# Patient Record
Sex: Female | Born: 1980 | Race: Black or African American | Hispanic: No | Smoking: Current every day smoker
Health system: Southern US, Community
[De-identification: ages and names within clinical notes are randomized; demographics above are authoritative.]

## PROBLEM LIST (undated history)

## (undated) ENCOUNTER — Emergency Department (HOSPITAL_COMMUNITY): Payer: BLUE CROSS/BLUE SHIELD

## (undated) DIAGNOSIS — I10 Essential (primary) hypertension: Secondary | ICD-10-CM

## (undated) DIAGNOSIS — T7840XA Allergy, unspecified, initial encounter: Secondary | ICD-10-CM

## (undated) DIAGNOSIS — A5609 Other chlamydial infection of lower genitourinary tract: Secondary | ICD-10-CM

## (undated) DIAGNOSIS — A630 Anogenital (venereal) warts: Secondary | ICD-10-CM

## (undated) HISTORY — PX: TUBAL LIGATION: SHX77

## (undated) HISTORY — DX: Allergy, unspecified, initial encounter: T78.40XA

## (undated) HISTORY — DX: Other chlamydial infection of lower genitourinary tract: A56.09

## (undated) HISTORY — DX: Essential (primary) hypertension: I10

## (undated) HISTORY — DX: Anogenital (venereal) warts: A63.0

## (undated) HISTORY — PX: WISDOM TOOTH EXTRACTION: SHX21

---
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
## 1998-01-10 ENCOUNTER — Encounter: Admission: RE | Admit: 1998-01-10 | Discharge: 1998-01-10 | Payer: Self-pay | Admitting: Family Medicine

## 1998-01-19 ENCOUNTER — Inpatient Hospital Stay (HOSPITAL_COMMUNITY): Admission: AD | Admit: 1998-01-19 | Discharge: 1998-01-19 | Payer: Self-pay | Admitting: Obstetrics & Gynecology

## 1998-01-20 ENCOUNTER — Encounter: Payer: Self-pay | Admitting: Obstetrics

## 1998-01-20 ENCOUNTER — Inpatient Hospital Stay (HOSPITAL_COMMUNITY): Admission: AD | Admit: 1998-01-20 | Discharge: 1998-01-20 | Payer: Self-pay | Admitting: Obstetrics

## 1998-02-07 ENCOUNTER — Encounter: Admission: RE | Admit: 1998-02-07 | Discharge: 1998-02-07 | Payer: Self-pay | Admitting: Sports Medicine

## 1998-03-07 ENCOUNTER — Encounter: Admission: RE | Admit: 1998-03-07 | Discharge: 1998-03-07 | Payer: Self-pay | Admitting: Sports Medicine

## 1998-03-07 ENCOUNTER — Other Ambulatory Visit: Admission: RE | Admit: 1998-03-07 | Discharge: 1998-03-07 | Payer: Self-pay | Admitting: Family Medicine

## 1998-04-13 ENCOUNTER — Encounter: Admission: RE | Admit: 1998-04-13 | Discharge: 1998-04-13 | Payer: Self-pay | Admitting: Family Medicine

## 1998-05-15 ENCOUNTER — Encounter: Admission: RE | Admit: 1998-05-15 | Discharge: 1998-05-15 | Payer: Self-pay | Admitting: Family Medicine

## 1998-07-05 ENCOUNTER — Encounter: Admission: RE | Admit: 1998-07-05 | Discharge: 1998-07-05 | Payer: Self-pay | Admitting: Family Medicine

## 1998-07-14 ENCOUNTER — Encounter: Admission: RE | Admit: 1998-07-14 | Discharge: 1998-07-14 | Payer: Self-pay | Admitting: Family Medicine

## 1998-08-03 ENCOUNTER — Other Ambulatory Visit: Admission: RE | Admit: 1998-08-03 | Discharge: 1998-08-03 | Payer: Self-pay | Admitting: Emergency Medicine

## 1998-08-28 ENCOUNTER — Encounter: Admission: RE | Admit: 1998-08-28 | Discharge: 1998-08-28 | Payer: Self-pay | Admitting: Family Medicine

## 1998-09-04 ENCOUNTER — Encounter: Admission: RE | Admit: 1998-09-04 | Discharge: 1998-09-04 | Payer: Self-pay | Admitting: Family Medicine

## 1998-09-14 ENCOUNTER — Ambulatory Visit (HOSPITAL_COMMUNITY): Admission: RE | Admit: 1998-09-14 | Discharge: 1998-09-14 | Payer: Self-pay | Admitting: Obstetrics & Gynecology

## 1998-09-15 ENCOUNTER — Encounter: Admission: RE | Admit: 1998-09-15 | Discharge: 1998-09-15 | Payer: Self-pay | Admitting: Family Medicine

## 1998-09-25 ENCOUNTER — Encounter: Admission: RE | Admit: 1998-09-25 | Discharge: 1998-09-25 | Payer: Self-pay | Admitting: Family Medicine

## 1998-09-28 ENCOUNTER — Encounter: Admission: RE | Admit: 1998-09-28 | Discharge: 1998-11-02 | Payer: Self-pay | Admitting: *Deleted

## 1998-10-16 ENCOUNTER — Encounter: Admission: RE | Admit: 1998-10-16 | Discharge: 1998-10-16 | Payer: Self-pay | Admitting: Family Medicine

## 1998-10-19 ENCOUNTER — Encounter: Admission: RE | Admit: 1998-10-19 | Discharge: 1998-10-19 | Payer: Self-pay | Admitting: Family Medicine

## 1998-10-26 ENCOUNTER — Encounter: Admission: RE | Admit: 1998-10-26 | Discharge: 1998-10-26 | Payer: Self-pay | Admitting: Family Medicine

## 1998-10-30 ENCOUNTER — Encounter: Admission: RE | Admit: 1998-10-30 | Discharge: 1998-10-30 | Payer: Self-pay | Admitting: Family Medicine

## 1998-11-30 ENCOUNTER — Encounter: Admission: RE | Admit: 1998-11-30 | Discharge: 1998-11-30 | Payer: Self-pay | Admitting: Family Medicine

## 1998-12-25 ENCOUNTER — Inpatient Hospital Stay (HOSPITAL_COMMUNITY): Admission: AD | Admit: 1998-12-25 | Discharge: 1998-12-25 | Payer: Self-pay | Admitting: Obstetrics & Gynecology

## 1999-01-02 ENCOUNTER — Encounter: Admission: RE | Admit: 1999-01-02 | Discharge: 1999-01-02 | Payer: Self-pay | Admitting: Sports Medicine

## 1999-01-16 ENCOUNTER — Encounter: Admission: RE | Admit: 1999-01-16 | Discharge: 1999-01-16 | Payer: Self-pay | Admitting: Family Medicine

## 1999-01-30 ENCOUNTER — Encounter: Admission: RE | Admit: 1999-01-30 | Discharge: 1999-01-30 | Payer: Self-pay | Admitting: Family Medicine

## 1999-02-07 ENCOUNTER — Inpatient Hospital Stay (HOSPITAL_COMMUNITY): Admission: AD | Admit: 1999-02-07 | Discharge: 1999-02-07 | Payer: Self-pay | Admitting: *Deleted

## 1999-02-09 ENCOUNTER — Encounter: Admission: RE | Admit: 1999-02-09 | Discharge: 1999-02-09 | Payer: Self-pay | Admitting: Family Medicine

## 1999-02-13 ENCOUNTER — Encounter: Admission: RE | Admit: 1999-02-13 | Discharge: 1999-02-13 | Payer: Self-pay | Admitting: Sports Medicine

## 1999-02-23 ENCOUNTER — Encounter: Admission: RE | Admit: 1999-02-23 | Discharge: 1999-02-23 | Payer: Self-pay | Admitting: Family Medicine

## 1999-03-01 ENCOUNTER — Inpatient Hospital Stay (HOSPITAL_COMMUNITY): Admission: AD | Admit: 1999-03-01 | Discharge: 1999-03-01 | Payer: Self-pay | Admitting: *Deleted

## 1999-03-02 ENCOUNTER — Encounter: Admission: RE | Admit: 1999-03-02 | Discharge: 1999-03-02 | Payer: Self-pay | Admitting: Family Medicine

## 1999-03-02 ENCOUNTER — Inpatient Hospital Stay (HOSPITAL_COMMUNITY): Admission: AD | Admit: 1999-03-02 | Discharge: 1999-03-04 | Payer: Self-pay | Admitting: *Deleted

## 1999-04-16 ENCOUNTER — Encounter: Admission: RE | Admit: 1999-04-16 | Discharge: 1999-04-16 | Payer: Self-pay | Admitting: Family Medicine

## 1999-06-21 ENCOUNTER — Encounter: Admission: RE | Admit: 1999-06-21 | Discharge: 1999-06-21 | Payer: Self-pay | Admitting: Sports Medicine

## 1999-07-13 ENCOUNTER — Encounter: Admission: RE | Admit: 1999-07-13 | Discharge: 1999-07-13 | Payer: Self-pay | Admitting: Family Medicine

## 2000-02-21 ENCOUNTER — Encounter: Admission: RE | Admit: 2000-02-21 | Discharge: 2000-02-21 | Payer: Self-pay | Admitting: Family Medicine

## 2000-02-21 ENCOUNTER — Other Ambulatory Visit: Admission: RE | Admit: 2000-02-21 | Discharge: 2000-02-21 | Payer: Self-pay | Admitting: Obstetrics & Gynecology

## 2004-12-12 ENCOUNTER — Ambulatory Visit: Payer: Self-pay | Admitting: Family Medicine

## 2005-04-30 ENCOUNTER — Ambulatory Visit: Payer: Self-pay | Admitting: Family Medicine

## 2005-05-28 ENCOUNTER — Ambulatory Visit: Payer: Self-pay | Admitting: Sports Medicine

## 2005-05-30 ENCOUNTER — Ambulatory Visit: Payer: Self-pay | Admitting: Family Medicine

## 2005-07-08 ENCOUNTER — Ambulatory Visit: Payer: Self-pay | Admitting: Family Medicine

## 2005-08-05 ENCOUNTER — Ambulatory Visit: Payer: Self-pay | Admitting: Family Medicine

## 2005-09-01 ENCOUNTER — Encounter (INDEPENDENT_AMBULATORY_CARE_PROVIDER_SITE_OTHER): Payer: Self-pay | Admitting: *Deleted

## 2005-09-01 LAB — CONVERTED CEMR LAB

## 2005-09-24 ENCOUNTER — Ambulatory Visit: Payer: Self-pay | Admitting: Family Medicine

## 2005-09-24 ENCOUNTER — Encounter (INDEPENDENT_AMBULATORY_CARE_PROVIDER_SITE_OTHER): Payer: Self-pay | Admitting: Specialist

## 2005-09-24 ENCOUNTER — Other Ambulatory Visit: Admission: RE | Admit: 2005-09-24 | Discharge: 2005-09-24 | Payer: Self-pay | Admitting: Family Medicine

## 2005-09-30 ENCOUNTER — Ambulatory Visit: Payer: Self-pay | Admitting: Family Medicine

## 2006-05-01 DIAGNOSIS — A630 Anogenital (venereal) warts: Secondary | ICD-10-CM

## 2006-05-01 DIAGNOSIS — L2089 Other atopic dermatitis: Secondary | ICD-10-CM

## 2006-05-01 DIAGNOSIS — R8789 Other abnormal findings in specimens from female genital organs: Secondary | ICD-10-CM

## 2006-05-01 HISTORY — DX: Anogenital (venereal) warts: A63.0

## 2006-05-02 ENCOUNTER — Encounter (INDEPENDENT_AMBULATORY_CARE_PROVIDER_SITE_OTHER): Payer: Self-pay | Admitting: *Deleted

## 2006-10-28 ENCOUNTER — Ambulatory Visit: Payer: Self-pay | Admitting: Family Medicine

## 2006-10-28 ENCOUNTER — Telehealth (INDEPENDENT_AMBULATORY_CARE_PROVIDER_SITE_OTHER): Payer: Self-pay | Admitting: *Deleted

## 2006-10-28 ENCOUNTER — Encounter (INDEPENDENT_AMBULATORY_CARE_PROVIDER_SITE_OTHER): Payer: Self-pay | Admitting: Family Medicine

## 2006-10-28 LAB — CONVERTED CEMR LAB
Chlamydia, DNA Probe: NEGATIVE
GC Probe Amp, Genital: NEGATIVE
KOH Prep: NEGATIVE

## 2006-10-29 ENCOUNTER — Encounter (INDEPENDENT_AMBULATORY_CARE_PROVIDER_SITE_OTHER): Payer: Self-pay | Admitting: Family Medicine

## 2006-11-05 ENCOUNTER — Encounter (INDEPENDENT_AMBULATORY_CARE_PROVIDER_SITE_OTHER): Payer: Self-pay | Admitting: Family Medicine

## 2006-11-05 ENCOUNTER — Ambulatory Visit: Payer: Self-pay | Admitting: Family Medicine

## 2006-11-05 ENCOUNTER — Other Ambulatory Visit: Admission: RE | Admit: 2006-11-05 | Discharge: 2006-11-05 | Payer: Self-pay | Admitting: Family Medicine

## 2006-11-05 DIAGNOSIS — J309 Allergic rhinitis, unspecified: Secondary | ICD-10-CM | POA: Insufficient documentation

## 2006-11-05 DIAGNOSIS — I10 Essential (primary) hypertension: Secondary | ICD-10-CM

## 2006-11-05 LAB — CONVERTED CEMR LAB
Calcium: 8.9 mg/dL (ref 8.4–10.5)
Cholesterol: 131 mg/dL (ref 0–200)
Glucose, Bld: 100 mg/dL — ABNORMAL HIGH (ref 70–99)
HDL: 31 mg/dL — ABNORMAL LOW (ref >39)
MCHC: 31.9 g/dL (ref 30.0–36.0)
MCV: 92.7 fL (ref 78.0–100.0)
Platelets: 299 10*3/uL (ref 150–400)
Potassium: 4.1 meq/L (ref 3.5–5.3)
RBC: 4.36 M/uL (ref 3.87–5.11)
Sodium: 139 meq/L (ref 135–145)
Total CHOL/HDL Ratio: 4.2
Triglycerides: 132 mg/dL (ref <150)
VLDL: 26 mg/dL (ref 0–40)

## 2006-11-06 ENCOUNTER — Encounter (INDEPENDENT_AMBULATORY_CARE_PROVIDER_SITE_OTHER): Payer: Self-pay | Admitting: Family Medicine

## 2006-11-18 ENCOUNTER — Telehealth (INDEPENDENT_AMBULATORY_CARE_PROVIDER_SITE_OTHER): Payer: Self-pay | Admitting: *Deleted

## 2006-11-18 ENCOUNTER — Ambulatory Visit: Payer: Self-pay | Admitting: Family Medicine

## 2006-12-15 ENCOUNTER — Telehealth: Payer: Self-pay | Admitting: *Deleted

## 2007-01-12 ENCOUNTER — Ambulatory Visit: Payer: Self-pay | Admitting: Family Medicine

## 2007-03-26 ENCOUNTER — Telehealth: Payer: Self-pay | Admitting: *Deleted

## 2007-03-27 ENCOUNTER — Ambulatory Visit: Payer: Self-pay | Admitting: Family Medicine

## 2007-04-13 ENCOUNTER — Ambulatory Visit: Payer: Self-pay | Admitting: Family Medicine

## 2007-04-13 ENCOUNTER — Encounter: Payer: Self-pay | Admitting: Family Medicine

## 2007-04-13 ENCOUNTER — Other Ambulatory Visit: Admission: RE | Admit: 2007-04-13 | Discharge: 2007-04-13 | Payer: Self-pay | Admitting: Family Medicine

## 2007-04-13 LAB — CONVERTED CEMR LAB
Chlamydia, DNA Probe: NEGATIVE
GC Probe Amp, Genital: NEGATIVE
KOH Prep: NEGATIVE

## 2007-04-14 ENCOUNTER — Telehealth: Payer: Self-pay | Admitting: *Deleted

## 2007-04-15 ENCOUNTER — Encounter: Payer: Self-pay | Admitting: Family Medicine

## 2008-05-20 ENCOUNTER — Ambulatory Visit: Payer: Self-pay | Admitting: Family Medicine

## 2008-05-20 ENCOUNTER — Encounter (INDEPENDENT_AMBULATORY_CARE_PROVIDER_SITE_OTHER): Payer: Self-pay | Admitting: Family Medicine

## 2008-05-20 LAB — CONVERTED CEMR LAB
BUN: 12 mg/dL (ref 6–23)
Calcium: 9.5 mg/dL (ref 8.4–10.5)
Chlamydia, DNA Probe: NEGATIVE
Creatinine, Ser: 0.79 mg/dL (ref 0.40–1.20)
Glucose, Bld: 95 mg/dL (ref 70–99)
Potassium: 4.1 meq/L (ref 3.5–5.3)
Whiff Test: NEGATIVE

## 2008-05-22 ENCOUNTER — Encounter (INDEPENDENT_AMBULATORY_CARE_PROVIDER_SITE_OTHER): Payer: Self-pay | Admitting: Family Medicine

## 2008-06-21 ENCOUNTER — Ambulatory Visit: Payer: Self-pay | Admitting: Family Medicine

## 2008-06-21 ENCOUNTER — Encounter (INDEPENDENT_AMBULATORY_CARE_PROVIDER_SITE_OTHER): Payer: Self-pay | Admitting: Family Medicine

## 2008-06-21 ENCOUNTER — Encounter: Payer: Self-pay | Admitting: *Deleted

## 2008-06-21 DIAGNOSIS — E669 Obesity, unspecified: Secondary | ICD-10-CM

## 2008-06-21 LAB — CONVERTED CEMR LAB
Calcium: 9.1 mg/dL (ref 8.4–10.5)
Glucose, Bld: 124 mg/dL — ABNORMAL HIGH (ref 70–99)
Potassium: 4 meq/L (ref 3.5–5.3)
Sodium: 139 meq/L (ref 135–145)

## 2008-07-05 ENCOUNTER — Telehealth (INDEPENDENT_AMBULATORY_CARE_PROVIDER_SITE_OTHER): Payer: Self-pay | Admitting: Family Medicine

## 2008-07-05 ENCOUNTER — Encounter (INDEPENDENT_AMBULATORY_CARE_PROVIDER_SITE_OTHER): Payer: Self-pay | Admitting: Family Medicine

## 2008-07-05 ENCOUNTER — Encounter: Admission: RE | Admit: 2008-07-05 | Discharge: 2008-07-05 | Payer: Self-pay | Admitting: Family Medicine

## 2008-07-28 ENCOUNTER — Ambulatory Visit: Payer: Self-pay | Admitting: Family Medicine

## 2008-07-28 ENCOUNTER — Encounter (INDEPENDENT_AMBULATORY_CARE_PROVIDER_SITE_OTHER): Payer: Self-pay | Admitting: Family Medicine

## 2008-07-28 LAB — CONVERTED CEMR LAB: Varicella IgG: 4.02 — ABNORMAL HIGH

## 2008-08-09 ENCOUNTER — Ambulatory Visit: Payer: Self-pay | Admitting: Family Medicine

## 2008-08-12 ENCOUNTER — Ambulatory Visit: Payer: Self-pay | Admitting: Family Medicine

## 2008-08-12 ENCOUNTER — Encounter (INDEPENDENT_AMBULATORY_CARE_PROVIDER_SITE_OTHER): Payer: Self-pay | Admitting: Family Medicine

## 2008-08-12 LAB — CONVERTED CEMR LAB

## 2008-08-15 ENCOUNTER — Ambulatory Visit: Payer: Self-pay | Admitting: Family Medicine

## 2008-08-15 DIAGNOSIS — A5609 Other chlamydial infection of lower genitourinary tract: Secondary | ICD-10-CM

## 2008-08-15 HISTORY — DX: Other chlamydial infection of lower genitourinary tract: A56.09

## 2008-08-15 LAB — CONVERTED CEMR LAB: Chlamydia, DNA Probe: POSITIVE — AB

## 2008-08-29 ENCOUNTER — Ambulatory Visit: Payer: Self-pay | Admitting: Family Medicine

## 2008-09-16 ENCOUNTER — Telehealth (INDEPENDENT_AMBULATORY_CARE_PROVIDER_SITE_OTHER): Payer: Self-pay | Admitting: Family Medicine

## 2008-09-20 ENCOUNTER — Encounter: Payer: Self-pay | Admitting: Family Medicine

## 2008-09-20 ENCOUNTER — Ambulatory Visit: Payer: Self-pay | Admitting: Family Medicine

## 2008-09-20 LAB — CONVERTED CEMR LAB
Chlamydia, DNA Probe: NEGATIVE
GC Probe Amp, Genital: NEGATIVE
Ketones, urine, test strip: NEGATIVE
Nitrite: NEGATIVE
Urobilinogen, UA: 0.2

## 2008-11-07 ENCOUNTER — Encounter (INDEPENDENT_AMBULATORY_CARE_PROVIDER_SITE_OTHER): Payer: Self-pay | Admitting: *Deleted

## 2008-11-07 DIAGNOSIS — F17200 Nicotine dependence, unspecified, uncomplicated: Secondary | ICD-10-CM

## 2008-11-25 ENCOUNTER — Ambulatory Visit: Payer: Self-pay | Admitting: Family Medicine

## 2009-01-03 ENCOUNTER — Encounter: Payer: Self-pay | Admitting: Family Medicine

## 2009-01-03 ENCOUNTER — Other Ambulatory Visit: Admission: RE | Admit: 2009-01-03 | Discharge: 2009-01-03 | Payer: Self-pay | Admitting: Family Medicine

## 2009-01-03 ENCOUNTER — Ambulatory Visit: Payer: Self-pay | Admitting: Family Medicine

## 2009-01-05 LAB — CONVERTED CEMR LAB: Chlamydia, DNA Probe: NEGATIVE

## 2009-01-11 ENCOUNTER — Encounter: Payer: Self-pay | Admitting: Family Medicine

## 2009-03-27 ENCOUNTER — Telehealth: Payer: Self-pay | Admitting: Family Medicine

## 2009-03-28 ENCOUNTER — Ambulatory Visit: Payer: Self-pay | Admitting: Family Medicine

## 2009-03-28 LAB — CONVERTED CEMR LAB
Blood in Urine, dipstick: NEGATIVE
Ketones, urine, test strip: NEGATIVE
Nitrite: NEGATIVE
Urobilinogen, UA: 1

## 2009-06-01 ENCOUNTER — Telehealth: Payer: Self-pay | Admitting: Family Medicine

## 2009-06-06 ENCOUNTER — Encounter: Payer: Self-pay | Admitting: Family Medicine

## 2009-06-06 ENCOUNTER — Ambulatory Visit: Payer: Self-pay | Admitting: Family Medicine

## 2009-06-06 DIAGNOSIS — F329 Major depressive disorder, single episode, unspecified: Secondary | ICD-10-CM

## 2009-06-07 LAB — CONVERTED CEMR LAB
FSH: 4.7 milliintl units/mL
HCT: 37.8 % (ref 36.0–46.0)
Hemoglobin: 12 g/dL (ref 12.0–15.0)
MCHC: 31.7 g/dL (ref 30.0–36.0)
MCV: 88.9 fL (ref 78.0–100.0)
Prolactin: 7.4 ng/mL
RDW: 14.2 % (ref 11.5–15.5)

## 2009-08-02 ENCOUNTER — Telehealth: Payer: Self-pay | Admitting: Family Medicine

## 2009-08-03 ENCOUNTER — Ambulatory Visit: Payer: Self-pay | Admitting: Family Medicine

## 2009-08-03 ENCOUNTER — Encounter: Payer: Self-pay | Admitting: Family Medicine

## 2009-08-03 LAB — CONVERTED CEMR LAB
Bilirubin Urine: NEGATIVE
Nitrite: NEGATIVE
Protein, U semiquant: NEGATIVE
Urobilinogen, UA: 0.2
Whiff Test: POSITIVE

## 2009-08-06 ENCOUNTER — Encounter: Payer: Self-pay | Admitting: Family Medicine

## 2009-08-10 ENCOUNTER — Encounter: Payer: Self-pay | Admitting: Family Medicine

## 2009-09-12 ENCOUNTER — Telehealth: Payer: Self-pay | Admitting: Family Medicine

## 2010-01-11 ENCOUNTER — Telehealth (INDEPENDENT_AMBULATORY_CARE_PROVIDER_SITE_OTHER): Payer: Self-pay | Admitting: *Deleted

## 2010-01-11 ENCOUNTER — Ambulatory Visit: Payer: Self-pay | Admitting: Family Medicine

## 2010-01-12 ENCOUNTER — Ambulatory Visit: Payer: Self-pay | Admitting: Family Medicine

## 2010-01-12 ENCOUNTER — Encounter: Payer: Self-pay | Admitting: Family Medicine

## 2010-01-12 DIAGNOSIS — N760 Acute vaginitis: Secondary | ICD-10-CM | POA: Insufficient documentation

## 2010-01-12 LAB — CONVERTED CEMR LAB
Chlamydia, DNA Probe: NEGATIVE
GC Probe Amp, Genital: NEGATIVE
HCV Ab: NEGATIVE
HIV: NONREACTIVE

## 2010-01-16 ENCOUNTER — Encounter: Payer: Self-pay | Admitting: Family Medicine

## 2010-01-17 ENCOUNTER — Telehealth: Payer: Self-pay | Admitting: Family Medicine

## 2010-03-23 IMAGING — US US TRANSVAGINAL NON-OB
1 series · 14 of 25 positions shown · non-contrast
Comparison: None

CLINICAL DATA: 6 months of dyspareunia.

TRANSABDOMINAL AND TRANSVAGINAL ULTRASOUND OF PELVIS
TECHNIQUE: Both transabdominal and transvaginal ultrasound
examinations of the pelvis were performed including evaluation of
the uterus, ovaries, adnexal regions, and pelvic cul-de-sac.

[Series 1: us transvaginal non-ob · 0.29mm/px · 14 of 39 slices shown]
[im 1/39]
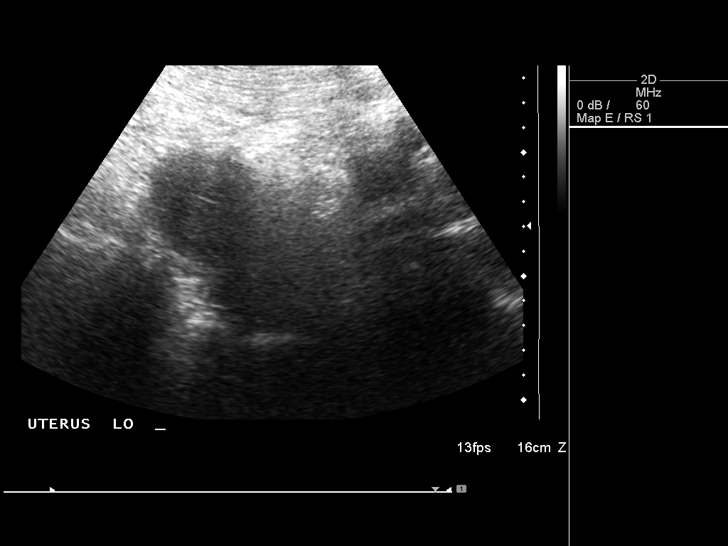
[im 4/39]
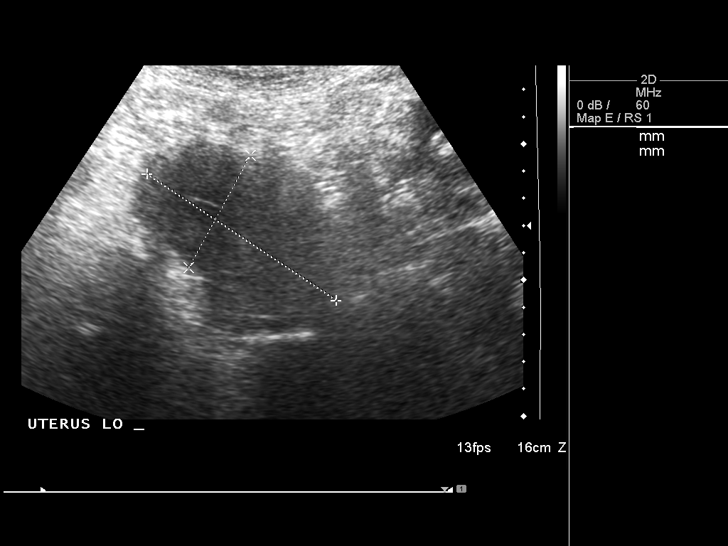
[im 7/39]
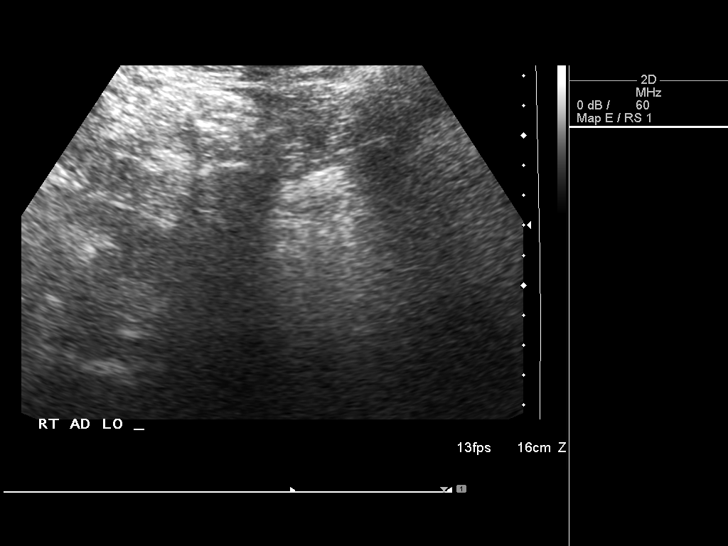
[im 10/39]
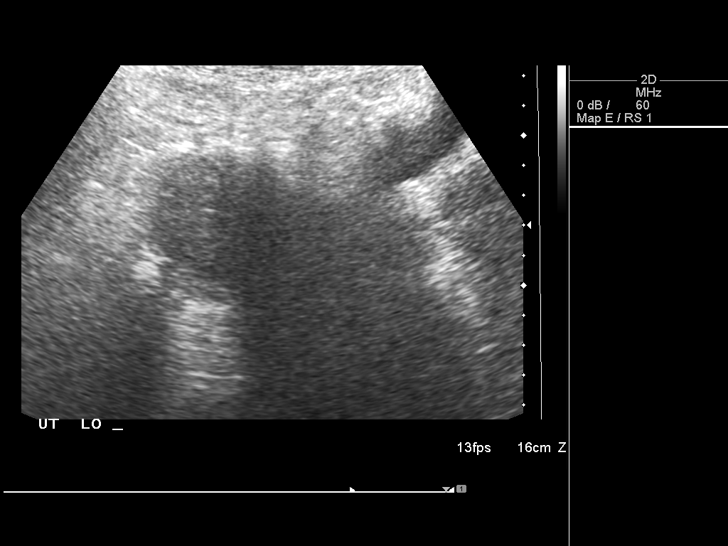
[im 13/39]
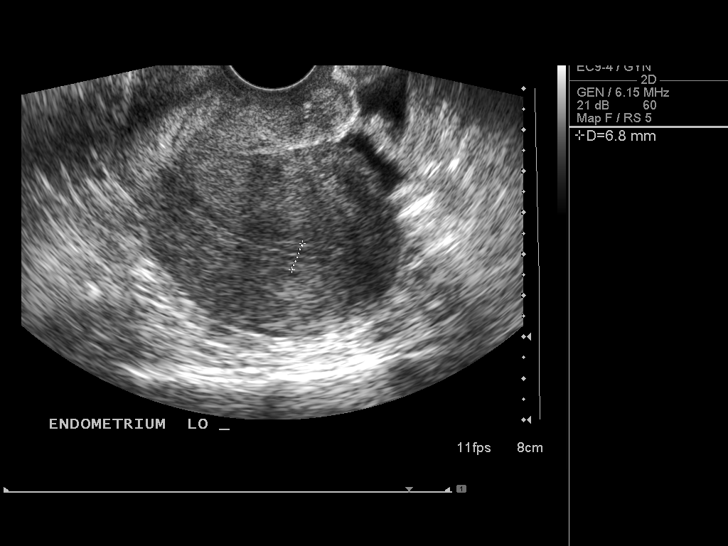
[im 15/39]
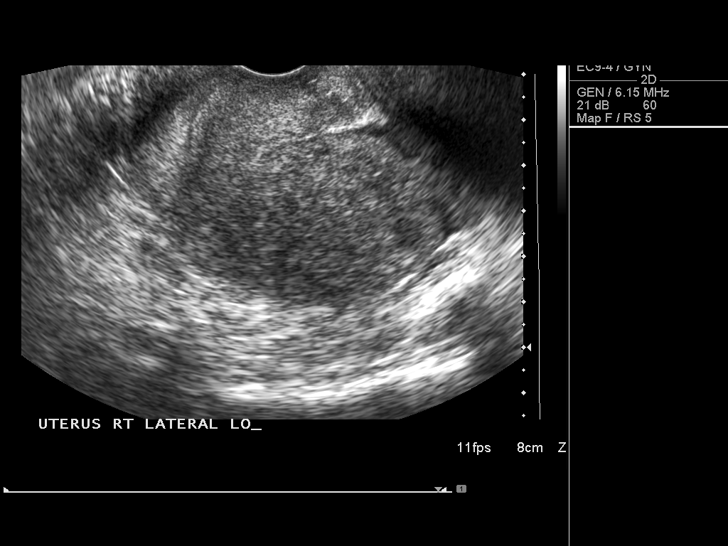
[im 18/39]
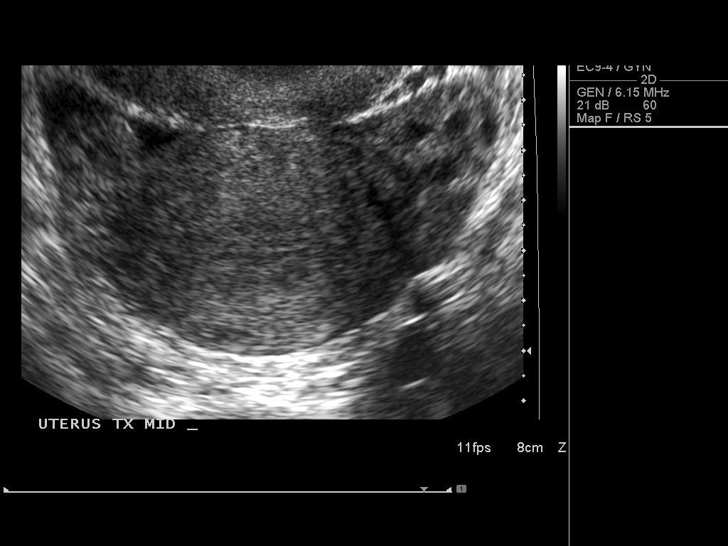
[im 21/39]
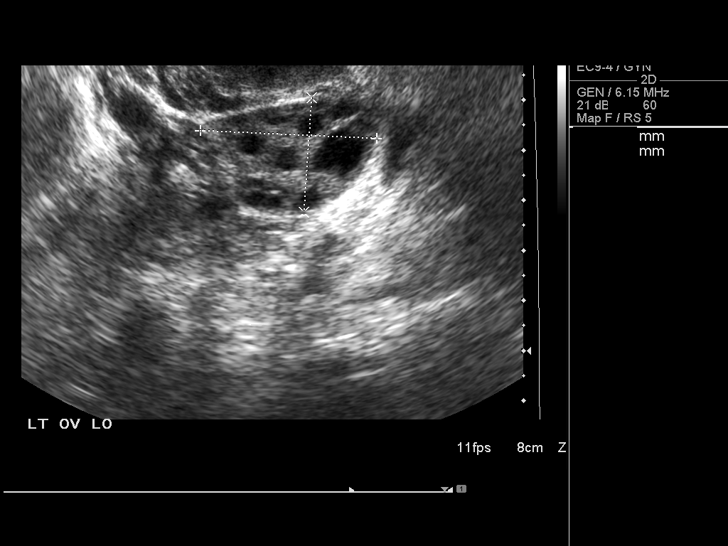
[im 24/39]
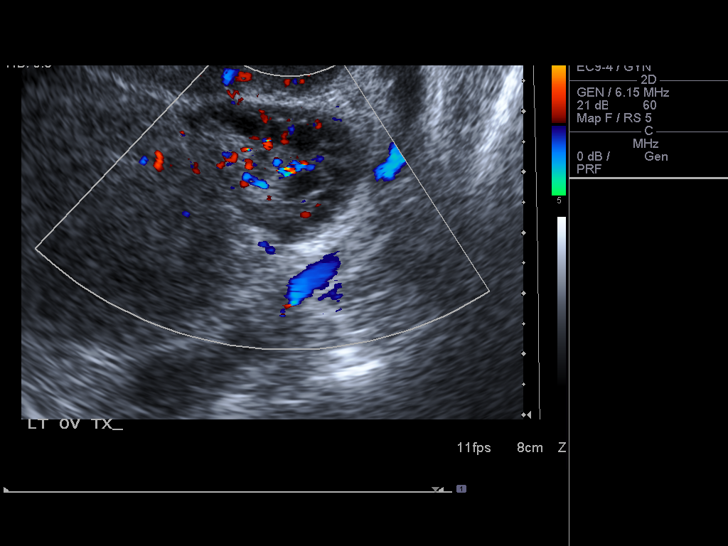
[im 26/39]
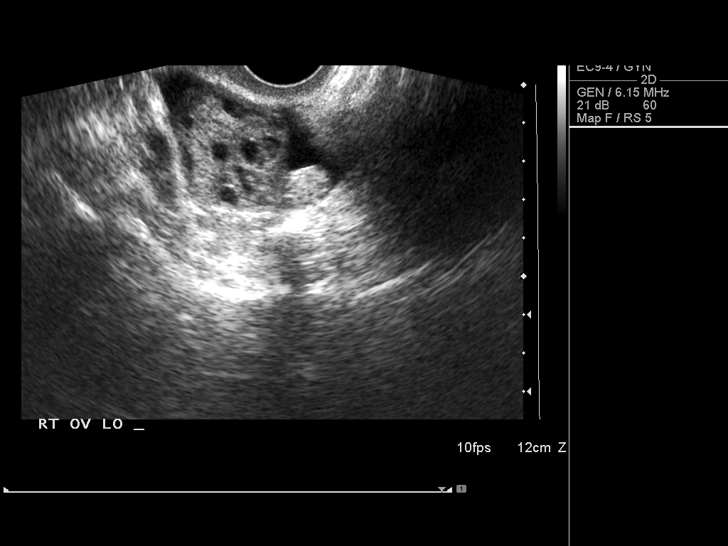
[im 29/39]
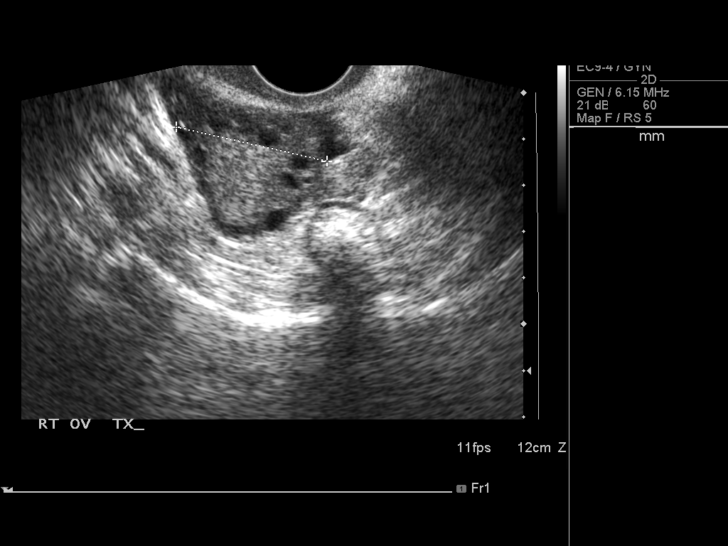
[im 32/39]
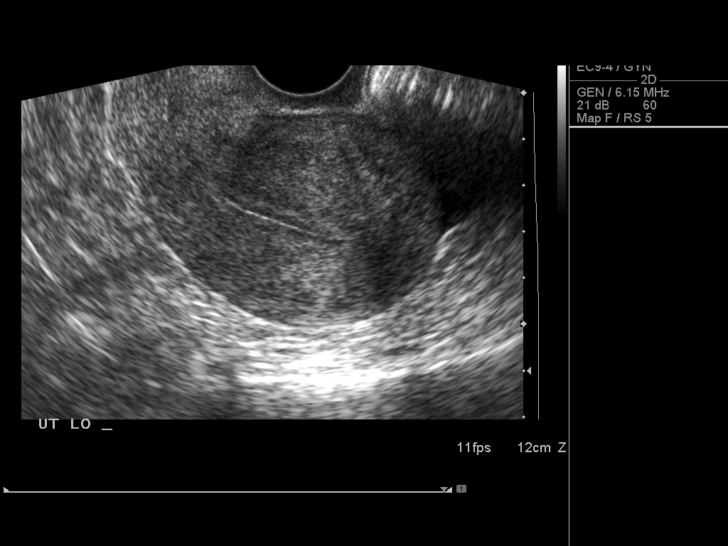
[im 35/39]
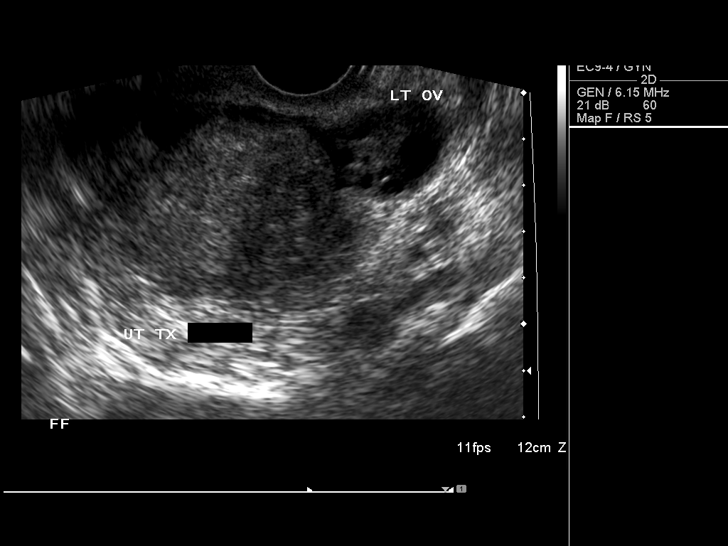
[im 39/39]
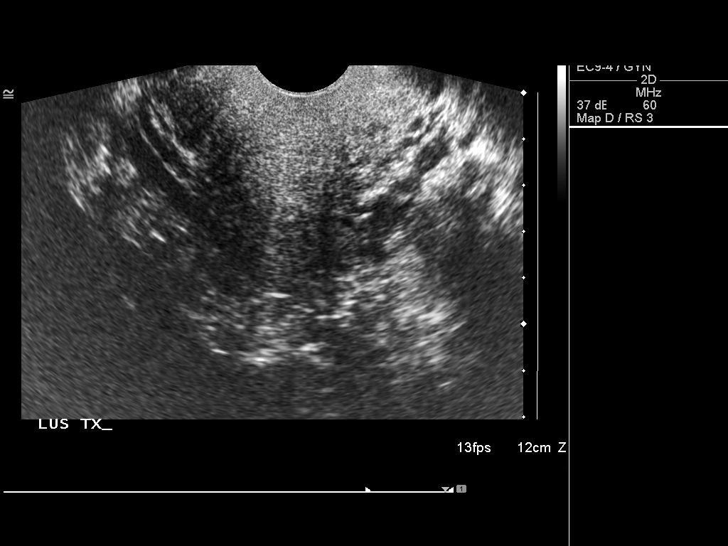

[14 of 25 positions shown; findings below may reference images not displayed]

FINDINGS: Uterus:  Anatomic variant retroflexion of the uterus which is
normal in size measures 8.4 cm long X 4.7 cm AP X 5.7 cm wide.
Uterus is otherwise sonographically normal-appearing.

Endometrium:  Sonographically normal measuring 7 mm thickness.

Right Ovary:  Sonographically normal measuring 4.0 cm long X 3.2 cm
AP X 3.3 cm wide.

Left Ovary:  Sonographically normal measuring 3.5 cm long X 2.3 cm
AP X 2.5 cm wide.

Other Findings:  Slight cul-de-sac free fluid seen transvaginally
only favors physiologic ovulation.
IMPRESSION: 1.  Probable minimal physiologic ovulatory fluid seen
transvaginally only.
2.  Retroflexion of the uterus.
3.  Otherwise, normal.

## 2010-04-03 NOTE — Miscellaneous (Signed)
   Clinical Lists Changes  Problems: Removed problem of VIRAL URI (ICD-465.9) Removed problem of DYSURIA (ICD-788.1) Removed problem of CONTACT OR EXPOSURE TO OTHER VIRAL DISEASES (ICD-V01.79) Removed problem of OTHER ACUTE SINUSITIS (ICD-461.8) Removed problem of DYSPAREUNIA (ICD-625.0) Removed problem of VAGINAL DISCHARGE (ICD-623.5) Removed problem of URINARY FREQUENCY (ICD-788.41) Removed problem of NEED PROPH VACC&INOCULAT AGAINST VIRAL HEP (ICD-V05.3) Removed problem of PRURITUS, VAGINAL (ICD-698.1) Removed problem of DYSPAREUNIA (ICD-625.0) Removed problem of OBESITY, UNSPECIFIED (ICD-278.00) Removed problem of *** LATEX ALLERGY *** (ICD-V15.07) Removed problem of BACTERIAL VAGINITIS (ICD-616.10) Removed problem of PREMENSTRUAL SYNDROME (ICD-625.4) Removed problem of PATELLO-FEMORAL SYNDROME (ICD-719.46)

## 2010-04-03 NOTE — Assessment & Plan Note (Signed)
Summary: discharge/itching/eo   Vital Signs:  Patient profile:   30 year old female Height:      70 inches Weight:      257 pounds BMI:     37.01 BSA:     2.32 Temp:     98.3 degrees F Pulse rate:   84 / minute BP sitting:   126 / 83  Vitals Entered By: Jone Baseman CMA (January 12, 2010 9:34 AM) CC: vaginal irritation Is Patient Diabetic? No Pain Assessment Patient in pain? yes     Location: lower abdomen Intensity: 6   Primary Care Provider:  Myrtie Soman  MD  CC:  vaginal irritation.  History of Present Illness: 1. Vaginal irritation - She thinks that she might have a yeast infection or bacterial vaginitis - She has a hx of chronic yeast infections and BV - She shaved down there recently and since then has had some vaginal irritation and itching  ROS: denies vaginal bleeding, dysuria.  Endorses crampy lower abdominal pain  2. STD exposure - She wants to be checked for STDs - She thinks that the man that she was sexually active with was with other women  ROS: denies fevers, lymph node swelling, vaginal lesions  Habits & Providers  Alcohol-Tobacco-Diet     Tobacco Status: current     Tobacco Counseling: to quit use of tobacco products     Cigarette Packs/Day: 0.5  Current Medications (verified): 1)  Zestoretic 20-12.5 Mg Tabs (Lisinopril-Hydrochlorothiazide) .... One Tablet Daily 2)  Triamcinolone Acetonide 0.1 % Oint (Triamcinolone Acetonide) .... Use 1-2 Times A Day On Arms 3)  Ibuprofen 800 Mg Tabs (Ibuprofen) .... One Three Times A Day As Needed 4)  Tramadol Hcl 50 Mg Tabs (Tramadol Hcl) .Marland Kitchen.. 1-2 Tabs Every 6 Hours As Needed For Menstrual-Related Pain 5)  Metronidazole 500 Mg Tabs (Metronidazole) .Marland Kitchen.. 1 By Mouth Bid  Allergies: 1)  * Latex  Past History:  Past Medical History: Reviewed history from 01/03/2009 and no changes required. h/o chlamydia, h/o spontaneous abortion; G6P4, PUPPS rash recurrent BV episodes 2008 (>3) - on suppressive tx  with metrogel 1-2 times a week recurrent yeast infections - on suppressive tx with twice monthly diflucan  HTN obesity PMS chlamydia 6/10  normal pelvic ultrasound 5/10  Social History: Reviewed history from 05/20/2008 and no changes required. Separated, from [place]. She's an only child.   Has 4 children (1 set of twins).  Works as a Set designer, and going to school fulltime for [employer] - due to graduate in July 2010.  Medicaid.  +tobacco.  No illicits.  No ETOH. Sexually active.  Physical Exam  General:  Vitals reviewed.  Obese.  No acute distress Neck:  no masses.   Lungs:  normal respiratory effort.   Heart:  normal rate and regular rhythm.   Abdomen:  soft, non-tender, normal bowel sounds, no distention, no guarding, no rigidity, and no rebound tenderness.   Genitalia:  minimal vaginal dischargenormal introitus, no external lesions, mucosa pink and moist, no vaginal or cervical lesions, no vaginal atrophy, normal uterus size and position, and no adnexal masses or tenderness.  No cervical motion tenderness. Skin:  color normal, no rashes, and no suspicious lesions.   Psych:  not anxious appearing and not depressed appearing.     Impression & Recommendations:  Problem # 1:  UNSPECIFIED VAGINITIS AND VULVOVAGINITIS (ICD-616.10) Assessment New No yeast or bacterial infection on wet prep.  Likely external irritation from shaving.  Advised OTC hydrocortisone cream for itching.  Her updated medication list for this problem includes:    Metronidazole 500 Mg Tabs (Metronidazole) .Marland Kitchen... 1 by mouth bid  Orders: Wet Prep- [facility] (04540) GC/Chlamydia-[facility] (87591/87491) [facility]- Est Level  3 (98119)  Problem # 2:  SEXUALLY TRANSMITTED DISEASE, EXPOSURE TO (ICD-V01.6) Assessment: New Check the following labs Orders: HIV-[facility] ([id number]) RPR-[facility] [id number]) Hep C Ab-[facility] ([id number]) Hep Bs Ag-[facility] ([id number]) [facility]- Est Level  3 (44010)  Complete Medication List: 1)  Zestoretic 20-12.5 Mg  Tabs (Lisinopril-hydrochlorothiazide) .... One tablet daily 2)  Triamcinolone Acetonide 0.1 % Oint (Triamcinolone acetonide) .... Use 1-2 times a day on arms 3)  Ibuprofen 800 Mg Tabs (Ibuprofen) .... One three times a day as needed 4)  Tramadol Hcl 50 Mg Tabs (Tramadol hcl) .Marland Kitchen.. 1-2 tabs every 6 hours as needed for menstrual-related pain 5)  Metronidazole 500 Mg Tabs (Metronidazole) .Marland Kitchen.. 1 by mouth bid   Orders Added: 1)  Wet Prep- [facility] [87210] 2)  GC/Chlamydia-[facility] [87591/87491] 3)  HIV-[facility] [[id number]] 4)  RPR-[facility] [[id number]] 5)  Hep C Ab-[facility] [[id number]] 6)  Hep Bs Ag-[facility] [[id number]] 7)  [facility]- Est Level  3 [99213]    Laboratory Results  Date/Time Received: January 12, 2010 9:51 AM  Date/Time Reported: January 12, 2010 10:09 AM   [employer] Source: vaginal WBC/hpf: 10-20 Bacteria/hpf: 3+  Rods Clue cells/hpf: none  Negative whiff Yeast/hpf: none Trichomonas/hpf: none Comments: ...........test performed by...........Marland KitchenTerese Door, CMA

## 2010-04-03 NOTE — Assessment & Plan Note (Signed)
Summary: female problem,tcb   Vital Signs:  Patient profile:   30 year old female Height:      70 inches Weight:      271 pounds BMI:     39.03 BSA:     2.38 Temp:     98.3 degrees F Pulse rate:   81 / minute BP sitting:   137 / 93  Vitals Entered By: Jone Baseman CMA (June 06, 2009 10:02 AM) CC: female problem Is Patient Diabetic? No Pain Assessment Patient in pain? yes     Location: stomach Intensity: 6 Type: crampy   Primary Care Provider:  Myrtie Soman  MD  CC:  female problem.  History of Present Illness: 1. Fatigue / abnormal bleeding Bleeding pattern: bleeds heavily for 2-3 days then tapers off; usually has her cycle every 30 days but came early this time (other cycles have been normal); notes increased cramping and pain and, although her bleeding has been reduced to spotting, still feels like she "has her cycle." Period started on Tuesday duration of probem: 1 week. Has been taking 800 mg ibuprofen and midol combination. Also very fatigued for the last several weeks -- this is the most distressing symptom for the patient Last time periods were normal: last cycle was normal Age of menarche: 50 Nulliparous?: no, para 4 Obese?: yes Diabetes?: no personal history of cancer? no family history of cancer? no history of anemia? "borderline" in the past prior treatment: lysteda x 2 months in November 2010 prior studies: abd/pelvic US 5/10, normal ultrasound at gyn 11/10  ROS: chest pain:  no   dyspnea: no    presyncope/syncope: no    fatigue: yes recent weight gain: yes  ~ 20 lbs in 1 year notes increased hair growth on face (states that this is common in her family) in the past year  2. Depressed mood? yes  Anhedonia? some  Sleep - worse, less restorative Interest - worse Guilt - denies Energy - decreased Concentration - denies Appetite - increased Psychomotor retardation - no  Suicidal ideation - no  *denies grandiosity or ever feeling "too  good."  Habits & Providers  Alcohol-Tobacco-Diet     Tobacco Status: current     Tobacco Counseling: to quit use of tobacco products     Cigarette Packs/Day: 0.5  Current Medications (verified): 1)  Zestoretic 20-12.5 Mg Tabs (Lisinopril-Hydrochlorothiazide) .... One Tablet Daily 2)  Triamcinolone Acetonide 0.1 % Oint (Triamcinolone Acetonide) .... Use 1-2 Times A Day On Arms 3)  Ibuprofen 800 Mg Tabs (Ibuprofen) .... One Three Times A Day As Needed  Allergies (verified): 1)  * Latex  Review of Systems       review of systems as noted in HPI section   Physical Exam  General:  vitals signs reviewed -- hypertensive (diastolic); obese  Neck:  ? mild fullness in thyroid area; no discrete nodules, mass, or asymmetry Lungs:  work of breathing unlabored, clear to auscultation bilaterally; no wheezes, rales, or ronchi; good air movement throughout  Heart:  regular rate and rhythm, no murmurs; normal s1/s2  Abdomen:  +BS, soft, obese; tender to palpation over lower abdomen; no rebound/guarding; no mass Genitalia:  Pelvic Exam:        External: normal female genitalia without lesions or masses        Vagina: normal without lesions or masses        Cervix: normal without lesions or masses; some cramping with insertion of GC/chlamydia probe  Adnexa: normal bimanual exam without masses or fullness        Uterus: normal by palpation        Pap smear: not performed Psych:  flat affect but good eye contact; above average intelligence; able to use humor; no grandiosity; thought process and content linear and goal-oriented   Impression & Recommendations:  Problem # 1:  FATIGUE (ICD-780.79) Assessment New  with increased abdominal cramping. Given 20 lb weight gain in past year and report of hursuitism (not noted today as pt plucks) and abnormal bleedin will check prolactin, FSH and TSH to eval for PCOS. Normal abd [place] 5/10 and 11/10. This issue is exacerbated by obesity so will schedule  pt in nutrition. She is agreeable to this. Check for STDs today. Tramadol for cramping and continue ibuprofen.   Orders: [facility]- Est  Level 4 (16109)  Problem # 2:  DEPRESSION, MAJOR (ICD-296.20) Assessment: New  meets criteria. Pt reluctant to start medicine today. Would choose venlafaxine given efficacy and reduced risk of weight gain.   Orders: [facility]- Est  Level 4 (60454)  Problem # 3:  OBESITY, UNSPECIFIED (ICD-278.00) Assessment: Deteriorated 20 lb weight gain over past year. Pt reports dietary indescretion and little/no exercise. States that she knows what she needs to do but is willing to meet with nutrition to discuss strategies for improving this issue. check glucose today.  Problem # 4:  CONTACT OR EXPOSURE TO OTHER VIRAL DISEASES (ICD-V01.79) pt requests STD testing today. Orders: GC/Chlamydia-[facility] (87591/87491) Wet Prep- [facility] [id number]) HIV-[facility] ([id number]) RPR-[facility] [phone])  Complete Medication List: 1)  Zestoretic 20-12.5 Mg Tabs (Lisinopril-hydrochlorothiazide) .... One tablet daily 2)  Triamcinolone Acetonide 0.1 % Oint (Triamcinolone acetonide) .... Use 1-2 times a day on arms 3)  Ibuprofen 800 Mg Tabs (Ibuprofen) .... One three times a day as needed 4)  Tramadol Hcl 50 Mg Tabs (Tramadol hcl) .Marland Kitchen.. 1-2 tabs every 6 hours as needed for menstrual-related pain  Other Orders: CBC-[facility] ([id number]) TSH-[facility] ([id number]) Prolactin-[facility] [id number]) FSH-[facility] [id number]) Glucose-[facility] ([id number])  Patient Instructions: 1)  schedule an appointment with our nutritionist 2)  follow-up with me in 1 month 3)  take the tramadol for pain 4)  we should have your lab results in the next 3-4 days, I'll plan to give you a call regarding them. Prescriptions: TRAMADOL HCL 50 MG TABS (TRAMADOL HCL) 1-2 tabs every 6 hours as needed for menstrual-related pain  #60 x 1   Entered and Authorized by:   Myrtie Soman  MD   Signed by:   Myrtie Soman  MD on 06/06/2009   Method used:    Electronically to        [facility] [phone]* (retail)       [address] [place], [place]  [address]       Ph: [phone] or [phone]       Fax: [phone]   RxID:   [id number]    Prevention & Chronic Care Immunizations   Influenza vaccine: Not documented    Tetanus booster: 05/02/2005: Done.    Pneumococcal vaccine: Not documented  Other Screening   Pap smear: NEGATIVE FOR INTRAEPITHELIAL LESIONS OR MALIGNANCY.  (01/03/2009)   Pap smear due: 01/03/2010   Smoking status: current  (06/06/2009)  Hypertension   Last Blood Pressure: 137 / 93  (06/06/2009)   Serum creatinine: 0.83  (06/21/2008)   Serum potassium 4.0  (06/21/2008)  Self-Management Support :    Hypertension self-management support: Not documented  Laboratory Results  Date/Time Received: June 06, 2009 10:35 AM  Date/Time Reported: June 06, 2009 10:47 AM   [employer] Source: vag WBC/hpf: 1-5 Bacteria/hpf: 3+  Cocci Clue cells/hpf: many  Positive whiff Yeast/hpf: few Trichomonas/hpf: none Comments: ...............test performed by......Marland KitchenBonnie A. [place]az[place], MLS (ASCP)cm

## 2010-04-03 NOTE — Letter (Signed)
Summary: LAB Letter  [facility]  [address]   [place], [place] [address]   Phone: [phone]  Fax: [phone]    08/06/2009  ZYA FINKLE [address] [place], [place]  [address]  Dear Ms. Bergquist,    YOur cervical cultures were normal.       Sincerely,   Denny Levy MD  Appended Document: LAB Letter mailed letter to pt

## 2010-04-03 NOTE — Progress Notes (Signed)
Summary: Triage   Phone Note Call from Patient Call back at Home Phone [phone]   Reason for Call: Talk to Nurse Summary of Call: possible allergic reaction to a condom Initial call taken by: Knox Royalty,  August 02, 2009 2:28 PM  Follow-up for Phone Call        LM Follow-up by: Golden Circle RN,  August 02, 2009 2:53 PM  Additional Follow-up for Phone Call Additional follow up Details #1::        burning around clitoris area. this is a new man & the first time she has used a condom in a "long time"  Previously had been with same man for a long time. it is slightly swollen. painful to touch. wants to be seen asap. this is day 2 of these symptoms. advised no sex until seen. do not use creams or lotions to the area. states she does not use lubes for sex. she agreed with plan & will be here for 9am appt Additional Follow-up by: Golden Circle RN,  August 02, 2009 2:55 PM

## 2010-04-03 NOTE — Consult Note (Signed)
[facility]: [facility]   Imported By: De Nurse 04/07/2009 17:25:36  _____________________________________________________________________  External Attachment:    Type:   Image     Comment:   External Document

## 2010-04-03 NOTE — Assessment & Plan Note (Signed)
Summary: uti & sinus infection per pt/[place]   Vital Signs:  Patient profile:   30 year old female Weight:      263 pounds Temp:     98.2 degrees F Pulse rate:   84 / minute BP sitting:   130 / 92  (right arm)  Vitals Entered By: Renato Battles slade,cma CC: 1.)low back pain x2 weeks. had UTI last month. did not finish her abx from the ED.2.) productive cough and pressure behind eye for few days. chils. Is Patient Diabetic? No Pain Assessment Patient in pain? no        Primary Care Provider:  Myrtie Soman  MD  CC:  1.)low back pain x2 weeks. had UTI last month. did not finish her abx from the ED.2.) productive cough and pressure behind eye for few days. chils..  History of Present Illness: patient complains of nasal congestion and cough for one week, reports associated with ha, pressure behind eyes and body aches.  Cough is productive, greenish phlegm that is worse at night.  Symptoms alleviated somewhat with Afrin.  No associated fever, sore throat, sob and wheezing.  Reports boyfriend has same symptoms.     Habits & Providers  Alcohol-Tobacco-Diet     Tobacco Status: current     Tobacco Counseling: to quit use of tobacco products  Current Medications (verified): 1)  Metronidazole 0.75 % Vag Gel (Metronidazole) .... One Applicator Full To Vagina One Weekly. Suppressive Therapy.  Dispense Quantity Sufficient For 1 Month. 2)  Zestoretic 20-12.5 Mg Tabs (Lisinopril-Hydrochlorothiazide) .... One Tablet Daily 3)  Triamcinolone Acetonide 0.1 % Oint (Triamcinolone Acetonide) .... Use 1-2 Times A Day On Arms 4)  Fluocinonide 0.05 % Oint (Fluocinonide) .... Apply Two Times A Day To Three Times A Day To Hands and Cover With Moisturizer and White Cotton Gloves. Dispense Largest Tube 5)  Zyrtec Allergy 10 Mg Tabs (Cetirizine Hcl) .... One Daily 6)  Ibuprofen 800 Mg Tabs (Ibuprofen) .... One Three Times A Day As Needed  Allergies (verified): 1)  * Latex  Review of Systems General:   Denies fatigue, fever, malaise, and weakness. Eyes:  Denies discharge, eye pain, and light sensitivity. ENT:  Complains of nasal congestion and sinus pressure; denies decreased hearing, difficulty swallowing, ear discharge, earache, hoarseness, nosebleeds, and ringing in ears. CV:  Denies chest pain or discomfort, fatigue, and shortness of breath with exertion. Resp:  Complains of cough and sputum productive; denies chest discomfort, chest pain with inspiration, shortness of breath, and wheezing. GI:  Denies diarrhea, nausea, and vomiting.  Physical Exam  General:  Well-developed,well-nourished,in no acute distress; alert,appropriate and cooperative throughout examination Head:  Normocephalic and atraumatic without obvious abnormalities. Eyes:  No corneal or conjunctival inflammation noted. Vision grossly normal. Ears:  External ear exam shows no significant lesions or deformities.  Otoscopic examination reveals bilateral cerumen impaction, honey colored wet. Hearing is grossly normal bilaterally. Nose:  External nasal examination shows no deformity or inflammation. Nasal mucosa are pink and moist without lesions or exudates. Mouth:  Oral mucosa and oropharynx without lesions or exudates.  Teeth in good repair. Neck:  No deformities, masses, or tenderness noted. Chest Wall:  No deformities, masses, or tenderness noted. Lungs:  Normal respiratory effort, chest expands symmetrically. Lungs are clear to auscultation, no crackles or wheezes. Heart:  Normal rate and regular rhythm. S1 and S2 normal without gallop, murmur, click, rub or other extra sounds. Abdomen:  Bowel sounds positive,abdomen soft and non-tender without masses, organomegaly or hernias noted. Cervical Nodes:  No lymphadenopathy noted   Impression & Recommendations:  Problem # 1:  VIRAL URI (ICD-465.9)  Given symptoms and acute onset, patient likely has viral uri.  education on symptomatic treatment, instructed to drink plenty  of fluids and return for symptoms that are worse, persist for greater than ten days or are associated with fever >101, sob, wheezing or chest pain. Her updated medication list for this problem includes:    Zyrtec Allergy 10 Mg Tabs (Cetirizine hcl) ..... One daily    Ibuprofen 800 Mg Tabs (Ibuprofen) ..... One three times a day as needed  Her updated medication list for this problem includes:    Zyrtec Allergy 10 Mg Tabs (Cetirizine hcl) ..... One daily    Ibuprofen 800 Mg Tabs (Ibuprofen) ..... One three times a day as needed  Orders: [facility]- Est Level  3 (09811)  Problem # 2:  DYSURIA (ICD-788.1)  Recently treated for UTI, she only took 3 days of 7 day course.  Urine normal today  Orders: [facility]- Est Level  3 (91478)  Complete Medication List: 1)  Metronidazole 0.75 % Vag Gel (Metronidazole) .... One applicator full to vagina one weekly. suppressive therapy.  dispense quantity sufficient for 1 month. 2)  Zestoretic 20-12.5 Mg Tabs (Lisinopril-hydrochlorothiazide) .... One tablet daily 3)  Triamcinolone Acetonide 0.1 % Oint (Triamcinolone acetonide) .... Use 1-2 times a day on arms 4)  Fluocinonide 0.05 % Oint (Fluocinonide) .... Apply two times a day to three times a day to hands and cover with moisturizer and white cotton gloves. dispense largest tube 5)  Zyrtec Allergy 10 Mg Tabs (Cetirizine hcl) .... One daily 6)  Ibuprofen 800 Mg Tabs (Ibuprofen) .... One three times a day as needed  Other Orders: Urinalysis-[facility] (00000)  Patient Instructions: 1)  This is likely a viral illness, that will resolve in 7-10 days, keep up with fluids. Prescriptions: ZESTORETIC 20-12.5 MG TABS (LISINOPRIL-HYDROCHLOROTHIAZIDE) one tablet daily  #30 x 1   Entered and Authorized by:   Luretha Murphy NP   Signed by:   Luretha Murphy NP on 03/28/2009   Method used:   [facility] to        [facility] [phone]* (retail)       [address] [place], [place]  [address]       Ph: [phone] or  [phone]       Fax: [phone]   RxID:   [id number]   Laboratory Results   Urine Tests  Date/Time Received: March 28, 2009 9:37 AM  Date/Time Reported: March 28, 2009 9:56 AM   Routine Urinalysis   Color: yellow Appearance: Clear Glucose: negative   (Normal Range: Negative) Bilirubin: negative   (Normal Range: Negative) Ketone: negative   (Normal Range: Negative) Spec. Gravity: 1.025   (Normal Range: 1.003-1.035) Blood: negative   (Normal Range: Negative) pH: 6.0   (Normal Range: 5.0-8.0) Protein: 30   (Normal Range: Negative) Urobilinogen: 1.0   (Normal Range: 0-1) Nitrite: negative   (Normal Range: Negative) Leukocyte Esterace: negative   (Normal Range: Negative)  Urine Microscopic WBC/HPF: 0-3 RBC/HPF: OCC Bacteria/HPF: 1+ Mucous/HPF: 1+ Epithelial/HPF: 1-5 Crystals/HPF: 1-5 CALCIUM OXALATE    Comments: ...........test performed by...........Marland KitchenTerese Door, CMA

## 2010-04-03 NOTE — Progress Notes (Signed)
Summary: triage   Phone Note Call from Patient Call back at Home Phone [phone]   Caller: Patient Summary of Call: Pt thinks she has a sinus inf and a bladder inf and wanting to be seen. Initial call taken by: Clydell Hakim,  March 27, 2009 10:30 AM  Follow-up for Phone Call        uti symptoms x 2 months .went to ED last month & got antibiotic & pain meds. now has severe low back pain. using ibuprofen which helps some.  sinus infection x 1 week. taking OTC for this (afrin,nyquil, etc.) having chills frequently wants appt tomorrow. placed with S. Saxon. pcp not available. told her to continue the motrin & drink plenty of water Follow-up by: Golden Circle RN,  March 27, 2009 10:54 AM

## 2010-04-03 NOTE — Progress Notes (Signed)
Summary: triage   Phone Note Call from Patient Call back at Home Phone [phone]   Caller: Patient Summary of Call: Wants to know if she can take B pollen as a weight loss supplement with the blood pressure meds she takes. Initial call taken by: Clydell Hakim,  June 01, 2009 3:08 PM  Follow-up for Phone Call        told her I will send this to her md & call her with his response Follow-up by: Golden Circle RN,  June 01, 2009 3:22 PM  Additional Follow-up for Phone Call Additional follow up Details #1::        I'm not familiar with bee pollen and cannot vouch for its safety. From what I do understand, there is no evidence that it is helpful in weight loss and therefore I don't recommend it. Diet and exercise are best.  Additional Follow-up by: Myrtie Soman  MD,  June 02, 2009 8:54 AM

## 2010-04-03 NOTE — Progress Notes (Signed)
   Phone Note Outgoing Call   Call placed by: Angelena Sole MD,  January 17, 2010 9:13 AM Action Taken: Phone Call Completed Summary of Call: Discussed with patient lab results.  She had no questions about those.  She was having worse vaginal discharge and I told her to come in and have it rechecked. Initial call taken by: Angelena Sole MD,  January 17, 2010 9:13 AM

## 2010-04-03 NOTE — Progress Notes (Signed)
   Phone Note Call from Patient   Caller: Patient Call For: [phone] Summary of Call: ? Yeast infection x 1 week  Initial call taken by: Abundio Miu,  January 11, 2010 8:48 AM  Follow-up for Phone Call        also having external irritation following shaving. ? yeast infection. appointment scheduled  today. Follow-up by: Theresia Lo RN,  January 11, 2010 9:13 AM

## 2010-04-03 NOTE — Assessment & Plan Note (Signed)
Summary: burning clitoris/[place]/everhart   Vital Signs:  Patient profile:   30 year old female Weight:      266.2 pounds Temp:     98.5 degrees F oral Pulse rate:   92 / minute Pulse rhythm:   regular BP sitting:   131 / 86  (left arm) Cuff size:   large  Vitals Entered By: Loralee Pacas CMA (August 03, 2009 9:32 AM) CC: burning in clitoris x 3 days    Primary Care Provider:  Myrtie Soman  MD  CC:  burning in clitoris x 3 days .  History of Present Illness: 3 days of burning and tingling in external GU area. Some vaginal d/c butthis is pretty chronic. Used condom withlast episode of intercourse--not latex, butafter that is when this irritation started. Nopain with intercourse  Allergies: 1)  * Latex  Physical Exam  Genitalia:  normal introitus and no external lesions.  slight thin white vaginal d/c. no adnexal tenderness. no masses Cervix appears normal, no cervical motion tenderness   Impression & Recommendations:  Problem # 1:  BACTERIAL VAGINITIS (ICD-616.10)  Her updated medication list for this problem includes:    Metronidazole 500 Mg Tabs (Metronidazole) .Marland Kitchen... 1 by mouth bid HO given for BV  Problem # 2:  ECZEMA, ATOPIC DERMATITIS (ICD-691.8)  Her updated medication list for this problem includes:    Triamcinolone Acetonide 0.1 % Oint (Triamcinolone acetonide) ..... Use 1-2 times a day on arms I think her external GU discomfort is related to the condomn use--she has known latex allergy ans she thiks the condom they used was NOT latex but unclear if it had spermicide or otheradditives. Will recommend three days OTC cortisone cream without aloe or benadryly. We discussed shopping for condoms. Also this could be prodrome for herpetic infection adn I discussed that altho unlikely--told her to return w new lesions or worsening signs.  Problem # 3:  PRURITUS, VAGINAL (ICD-698.1) also did GC / Chlamydia  Complete Medication List: 1)  Zestoretic 20-12.5 Mg Tabs  (Lisinopril-hydrochlorothiazide) .... One tablet daily 2)  Triamcinolone Acetonide 0.1 % Oint (Triamcinolone acetonide) .... Use 1-2 times a day on arms 3)  Ibuprofen 800 Mg Tabs (Ibuprofen) .... One three times a day as needed 4)  Tramadol Hcl 50 Mg Tabs (Tramadol hcl) .Marland Kitchen.. 1-2 tabs every 6 hours as needed for menstrual-related pain 5)  Metronidazole 500 Mg Tabs (Metronidazole) .Marland Kitchen.. 1 by mouth bid  Other Orders: Urinalysis-[facility] (00000) Wet Prep- [facility] [phone]) GC/Chlamydia-[facility] ([id number]) [facility]- Est Level  3 (19147) Prescriptions: METRONIDAZOLE 500 MG TABS (METRONIDAZOLE) 1 by mouth bid  #14 x 1   Entered and Authorized by:   Denny Levy MD   Signed by:   Denny Levy MD on 08/03/2009   Method used:   Electronically to        [facility] [phone][facility] (retail)       [address] [place], [place]  [address]       Ph: [phone] or [phone]       Fax: [phone]   RxID:   [id number]   Laboratory Results   Urine Tests  Date/Time Received: August 03, 2009 9:37 AM  Date/Time Reported: August 03, 2009 10:19 AM   Routine Urinalysis   Color: yellow Appearance: Clear Glucose: negative   (Normal Range: Negative) Bilirubin: negative   (Normal Range: Negative) Ketone: negative   (Normal Range: Negative) Spec. Gravity: 1.025   (Normal Range:  1.003-1.035) Blood: negative   (Normal Range: Negative) pH: 6.0   (Normal Range: 5.0-8.0) Protein: negative   (Normal Range: Negative) Urobilinogen: 0.2   (Normal Range: 0-1) Nitrite: negative   (Normal Range: Negative) Leukocyte Esterace: trace   (Normal Range: Negative)  Urine Microscopic WBC/HPF: 0-3 Bacteria/HPF: trace Mucous/HPF: 2+ Epithelial/HPF: 5-10 with few clue cells    Comments: ...............test performed by......Marland KitchenBonnie A. [place], MLS (ASCP)cm  Date/Time Received: August 03, 2009 9:37 AM  Date/Time Reported: August 03, 2009 10:22 AM   [place] Source: vag WBC/hpf: 1-5 Bacteria/hpf: 2+  Cocci Clue cells/hpf:  moderate  Positive whiff Yeast/hpf: none Trichomonas/hpf: none Comments: ...............test performed by......Marland KitchenBonnie A. [place], MLS (ASCP)cm    Appended Document: burning clitoris/[place]/everhart [phone]

## 2010-04-03 NOTE — Assessment & Plan Note (Signed)
Summary: ? yeast infection, vaginal  ,external  irritation/ls  PT LEFT WITHOUT BEING SEEN.Arlyss Repress CMA,  January 11, 2010 2:17 PM  Vital Signs:  Patient profile:   30 year old female Weight:      258 pounds Pulse rate:   83 / minute BP sitting:   123 / 89  (right arm) Cuff size:   large  Vitals Entered By: Arlyss Repress CMA, (January 11, 2010 1:57 PM) CC: VAG ITCHING X 1 WEEK. DENIES D/C. Is Patient Diabetic? No Pain Assessment Patient in pain? no        CC:  VAG ITCHING X 1 WEEK. DENIES D/C.Marland Kitchen  Habits & Providers  Alcohol-Tobacco-Diet     Tobacco Status: current     Tobacco Counseling: to quit use of tobacco products  Allergies: 1)  * Latex   Complete Medication List: 1)  Zestoretic 20-12.5 Mg Tabs (Lisinopril-hydrochlorothiazide) .... One tablet daily 2)  Triamcinolone Acetonide 0.1 % Oint (Triamcinolone acetonide) .... Use 1-2 times a day on arms 3)  Ibuprofen 800 Mg Tabs (Ibuprofen) .... One three times a day as needed 4)  Tramadol Hcl 50 Mg Tabs (Tramadol hcl) .Marland Kitchen.. 1-2 tabs every 6 hours as needed for menstrual-related pain 5)  Metronidazole 500 Mg Tabs (Metronidazole) .Marland Kitchen.. 1 by mouth bid  Other Orders: No Charge Patient Arrived (NCPA0) (NCPA0)   Orders Added: 1)  No Charge Patient Arrived (NCPA0) [NCPA0]

## 2010-04-03 NOTE — Progress Notes (Signed)
Summary: rx   Phone Note Call from Patient Call back at Home Phone [phone]   Caller: Patient Summary of Call: needs Diflucan called in for yeast inf - usually gets a 6 month supply [facility] [address] [place] Initial call taken by: De Nurse,  September 12, 2009 10:31 AM  Follow-up for Phone Call        LM. when she calls back will need appt. diflucan last rx in 2010. not on active list of meds. new pcp has not seen her & will not call in w/o a visit Follow-up by: Golden Circle RN,  September 12, 2009 10:43 AM  Additional Follow-up for Phone Call Additional follow up Details #1::        returning call Additional Follow-up by: Knox Royalty,  September 12, 2009 10:50 AM    Additional Follow-up for Phone Call Additional follow up Details #2::    states she cannot come in until the end of the week. states she will call back when she knows her schedule Follow-up by: Golden Circle RN,  September 12, 2009 11:04 AM

## 2010-04-03 NOTE — Miscellaneous (Signed)
   Clinical Lists Changes  Problems: Removed problem of SCREENING FOR MALIGNANT NEOPLASM OF THE CERVIX (ICD-V76.2) Removed problem of FATIGUE (ICD-780.79) Removed problem of PELVIC PAIN, CHRONIC (ICD-789.09)

## 2010-04-17 ENCOUNTER — Other Ambulatory Visit: Payer: Self-pay | Admitting: Family Medicine

## 2010-04-17 ENCOUNTER — Ambulatory Visit (INDEPENDENT_AMBULATORY_CARE_PROVIDER_SITE_OTHER): Payer: Medicaid Other | Admitting: Family Medicine

## 2010-04-17 ENCOUNTER — Encounter: Payer: Self-pay | Admitting: Family Medicine

## 2010-04-17 VITALS — BP 123/85 | HR 170 | Temp 98.6°F | Ht 70.0 in | Wt 254.3 lb

## 2010-04-17 DIAGNOSIS — N760 Acute vaginitis: Secondary | ICD-10-CM | POA: Insufficient documentation

## 2010-04-17 DIAGNOSIS — L988 Other specified disorders of the skin and subcutaneous tissue: Secondary | ICD-10-CM

## 2010-04-17 DIAGNOSIS — R234 Changes in skin texture: Secondary | ICD-10-CM | POA: Insufficient documentation

## 2010-04-17 LAB — POCT WET PREP (WET MOUNT)

## 2010-04-17 MED ORDER — FLUCONAZOLE 150 MG PO TABS: 150.0000 mg | ORAL_TABLET | Freq: Once | ORAL | Status: DC

## 2010-04-17 MED ORDER — MUPIROCIN CALCIUM 2 % EX CREA: TOPICAL_CREAM | Freq: Three times a day (TID) | CUTANEOUS | Status: AC

## 2010-04-17 NOTE — Assessment & Plan Note (Signed)
Think yeast infection is likely. Obtained wet prep and GC/CHL. Gave RX for flucanazole and will call pt if labs suggest something other than yeast.  Advised weight loss and smoking cessation.  Pt will see Dr. Katrinka Blazing to work on this issue. Pt is motivated.

## 2010-04-17 NOTE — Progress Notes (Signed)
Yeast: Has had history of chronic yeast infections. Has been on fluconazole suppressive therapy in the past when pregnant.  Noted painless white discharge 3 days ago. Recently had BV which she treated with a diluted apple cider vinegar solution bath.  This did not help much. She notes that the d/c is not associated with itching or other symptoms.   Pain on bottom: Notes an in tense pain/itching in her gluteal cleft. This happened after the vinegar solution bath. She tried bacitracin and hydrocortisone on it. No drainage. Not tender in the surrounding area.   ROS: No fevers, chills, anorexia, chest or abdominal pain. No dysurea.   EXAM:  VS noted.  Gen: Well NAD HEENT: MMM Lungs: CTABL Heart: RRR no MRG ABD: NABS, NT, ND Genital: Normal external genitalia. White d/c noted.  Cervix is normal appearing.  Rectal: Normal anus,  ingluteal cleft greater than 2cm from anus is a small fissure about 1-1.5cm long and 2mm wide. No drainage and no obvious area of induration or fluctuance. No surrounding erythremia.

## 2010-04-17 NOTE — Assessment & Plan Note (Signed)
Not sure why this happened. Perhaps the vinegar solution was irritating. I do not think this is an anal fissure, fistula or pilonidal cyst.  Plan for muciporin cream and follow up with Dr. Katrinka Blazing if not healing.

## 2010-04-17 NOTE — Patient Instructions (Signed)
Take the diflucan once and again in 3 days if symptoms present.  I will call you and leave a message if it is not yeast.  Use the muiporin 3x daily until better. Avoid vinegar baths.  Come back in a month or so to establish care with Dr. Katrinka Blazing.  Losing weight will help with your yeast infections.

## 2010-04-18 ENCOUNTER — Telehealth: Payer: Self-pay | Admitting: Family Medicine

## 2010-04-18 LAB — GC/CHLAMYDIA PROBE AMP, GENITAL
Chlamydia, DNA Probe: NEGATIVE
GC Probe Amp, Genital: NEGATIVE

## 2010-04-18 NOTE — Telephone Encounter (Signed)
Called and left a message. Changed the bactroban cream to ointment. Left a message.

## 2010-06-14 ENCOUNTER — Telehealth: Payer: Self-pay | Admitting: Family Medicine

## 2010-06-14 NOTE — Telephone Encounter (Signed)
Agree with plan 

## 2010-06-14 NOTE — Telephone Encounter (Signed)
Needs to talk to nurse regarding her cycle.

## 2010-06-14 NOTE — Telephone Encounter (Signed)
Patient states that her menstrual cycle is very regular and she always knows when she is going to start.  She was due to start today and so far has not.  She began having PMS symptoms this past Sunday (back pain, cramps, breast tenderness, etc).  She's afraid something is wrong.  Told her that cycles can change as women age and since today is her start date, to wait a few days to see if it comes on and if not to call us back.   Patient is sexually active and had a tubal after the birth of her twins 7 years ago.  Explained that if she does not start her period we would bring her in for a pregnancy test.  She will call us back tomorrow with an update.

## 2010-06-15 ENCOUNTER — Ambulatory Visit: Payer: Medicaid Other

## 2010-06-15 ENCOUNTER — Ambulatory Visit (INDEPENDENT_AMBULATORY_CARE_PROVIDER_SITE_OTHER): Payer: Medicaid Other | Admitting: Family Medicine

## 2010-06-15 VITALS — BP 125/75 | HR 81 | Temp 97.7°F | Ht 70.0 in | Wt 257.0 lb

## 2010-06-15 DIAGNOSIS — A499 Bacterial infection, unspecified: Secondary | ICD-10-CM

## 2010-06-15 DIAGNOSIS — N760 Acute vaginitis: Secondary | ICD-10-CM

## 2010-06-15 DIAGNOSIS — F3281 Premenstrual dysphoric disorder: Secondary | ICD-10-CM

## 2010-06-15 DIAGNOSIS — R300 Dysuria: Secondary | ICD-10-CM

## 2010-06-15 DIAGNOSIS — N912 Amenorrhea, unspecified: Secondary | ICD-10-CM

## 2010-06-15 DIAGNOSIS — B9689 Other specified bacterial agents as the cause of diseases classified elsewhere: Secondary | ICD-10-CM

## 2010-06-15 DIAGNOSIS — N943 Premenstrual tension syndrome: Secondary | ICD-10-CM

## 2010-06-15 LAB — POCT URINE PREGNANCY: Preg Test, Ur: NEGATIVE

## 2010-06-15 LAB — POCT URINALYSIS DIPSTICK
Glucose, UA: NEGATIVE
Nitrite, UA: NEGATIVE
Protein, UA: NEGATIVE
Urobilinogen, UA: 0.2

## 2010-06-15 LAB — POCT WET PREP (WET MOUNT)
Trichomonas Wet Prep HPF POC: NEGATIVE
Yeast Wet Prep HPF POC: NEGATIVE

## 2010-06-15 LAB — POCT UA - MICROSCOPIC ONLY

## 2010-06-15 NOTE — Progress Notes (Signed)
Pt stated that she is very regular when it comes to her menstrual cycle however; she feels like "ts just sitting there and it won't come down".  She is having all of the s/s of her menstrual cycle but it hasn't started yet. She is having lots of pain, cramping, pressure and swelling and mood swings.Loralee Pacas [place]

## 2010-06-16 LAB — GC/CHLAMYDIA PROBE AMP, GENITAL: Chlamydia, DNA Probe: NEGATIVE

## 2010-06-17 DIAGNOSIS — B9689 Other specified bacterial agents as the cause of diseases classified elsewhere: Secondary | ICD-10-CM | POA: Insufficient documentation

## 2010-06-17 DIAGNOSIS — F3281 Premenstrual dysphoric disorder: Secondary | ICD-10-CM | POA: Insufficient documentation

## 2010-06-17 NOTE — Assessment & Plan Note (Addendum)
Besides BV today, patient also with monthly symptoms of moderate to severe PMDD that causes her some to significant distress. She meets DSM-IV criteria for following:  -Feeling sad, hopeless, or self-deprecating   -Feeling tense, anxious, or "on edge"  -Marked lability of mood interspersed with frequent tearfulness   -Persistent irritability, anger, and increased interpersonal conflicts   -Difficulty concentrating Feeling fatigued, lethargic, or lacking in energy    -Other physical symptoms, such as breast tenderness or swelling, headaches, joint or muscle pain, a sensation of bloating, weight gain Recommended conservative management with hydration, calcium supplement, and hydration.  Recommended she re-consider an SSRI for this disorder, emphasizing she would only have to take it second half of her cycle (not all the time), and that her symptoms are severe enough where just conservative measures may not help. If SSRI does not appeal to her, then also reconsider OCP. Patient said she would think about this.  Vitamin B6 (not exceeding 100mg  daily) also found to be helpful. Patient interested in trying this for now while she decides whether she wants SSRI/OCP to help control symptoms.

## 2010-06-17 NOTE — Assessment & Plan Note (Addendum)
Although patient denies having usual symptoms of BV, wet prep shows BV. Rx for Flagyl. Patient also interested in suppressive therapy with metronidazole vaginal gel.

## 2010-06-17 NOTE — Progress Notes (Signed)
  Subjective:    Patient ID: Victoria Powers, female    DOB: 20-Jun-1980, 30 y.o.   MRN: [id number]  HPI Patient was seen as work-in for abdominal pain.   Feels bloated, lower abdominal pain for the past 5 days. Periods are usually very regular but period has been 1 day late so far. Patient is very distressed. She came to clinic today, waited 2 hours to see me, and then left. Found crying in her car but was able to persuade to come back to the clinic to see me. Feels like period is "stuck" and cannot get out. Thinks she will feel better if she can menstruate.  Patient has history of PMS. She was offered Paxil and OCP in the past. Did not want to take Paxil; does not like the thought of taking medications affecting her mood/thinking; maybe took about 2 tablets and then stopped. Does not want to take OCP; does not like thought of taking artificial hormones.  Last period 05/19/2010. Regular period. No heavy bleeding. No spotting between periods.  Patient has been sexually active with ex-boyfriend and another partner past several months. Has just had intercourse with ex-boyfriend in past couple of months. Is not sure if ex-boyfriend is having sex with other people; thinks it may be possible since he lives in different town.   Patient gets frequent yeast and BV infections. Has been given medication to use as needed for when she gets symptoms. Patient denies having usual symptoms of yeast or BV. Review of Systems Denies fever, vaginal discharge/foul-smelling discharge, dysuria.    Objective:   Physical Exam General: tearful, but consolable; does not appear to be in pain Psychiatric: tearful but does not appear depressed GU: vulvar appears healthy and normal with no lesions; vaginal no erythema/tenderness; milky white discharge in posterior fornix and cervical os; bloody mucus plug in os.     Assessment & Plan:

## 2010-06-18 ENCOUNTER — Telehealth: Payer: Self-pay | Admitting: *Deleted

## 2010-06-18 MED ORDER — METRONIDAZOLE 0.75 % VA GEL: 1.0000 | VAGINAL | Status: DC

## 2010-06-18 MED ORDER — METRONIDAZOLE 500 MG PO TABS
500.0000 mg | ORAL_TABLET | Freq: Two times a day (BID) | ORAL | Status: DC
Start: 2010-06-18 — End: 2011-04-24

## 2010-06-18 MED ORDER — PYRIDOXINE HCL 100 MG PO TABS: 100.0000 mg | ORAL_TABLET | Freq: Every day | ORAL | Status: AC

## 2010-06-18 NOTE — Telephone Encounter (Signed)
Message copied by Garen Grams on Mon Jun 18, 2010 11:06 AM ------      Message from: [facility], ANGELA      Created: Mon Jun 18, 2010 10:04 AM       Please call patient and inform her that 3 Rx sent to pharmacy this morning:       1. Flagyl (oral) for 7 days      2. Flagyl vaginal cream--use after completing 7 day course oral Flagyl; apply twice weekly for 4-6 months      3. Vitamin B6. One tablet a day to help with PMS symptoms.            Thank you.

## 2010-06-18 NOTE — Progress Notes (Signed)
Addended by: Priscella Mann on: 06/18/2010 07:28 AM   Modules accepted: Orders

## 2010-06-18 NOTE — Telephone Encounter (Signed)
Message copied by Garen Grams on Mon Jun 18, 2010 11:04 AM ------      Message from: [facility], ANGELA      Created: Mon Jun 18, 2010 10:04 AM       Please call patient and inform her that 3 Rx sent to pharmacy this morning:       1. Flagyl (oral) for 7 days      2. Flagyl vaginal cream--use after completing 7 day course oral Flagyl; apply twice weekly for 4-6 months      3. Vitamin B6. One tablet a day to help with PMS symptoms.            Thank you.

## 2010-06-18 NOTE — Telephone Encounter (Signed)
Informed patient of message from MD, she expressed understanding.

## 2010-06-20 ENCOUNTER — Telehealth: Payer: Self-pay | Admitting: Family Medicine

## 2010-06-20 NOTE — Telephone Encounter (Signed)
Was supposed to have 3 meds called in and only rec'd 2 - did not get the metrogel - [id number] [place], [place] - [address]

## 2010-06-20 NOTE — Telephone Encounter (Signed)
rx for Metrogel called to pharmacy . They did not receive on 06/18/2010. Patient notified.

## 2010-07-06 ENCOUNTER — Other Ambulatory Visit: Payer: Self-pay | Admitting: Family Medicine

## 2010-07-06 NOTE — Telephone Encounter (Signed)
Refill requets

## 2010-08-31 ENCOUNTER — Ambulatory Visit: Payer: Medicaid Other | Admitting: Family Medicine

## 2010-11-07 ENCOUNTER — Telehealth: Payer: Self-pay | Admitting: Family Medicine

## 2010-11-07 NOTE — Telephone Encounter (Signed)
There is a [facility]ill [facility] Lonn Georgia [facility].  She says she needs it today.

## 2010-11-07 NOTE — Telephone Encounter (Signed)
Refill Request was faxed.  The patient was called and a message was left that if she needs more of this medication, she will need to schedule an appointment.

## 2010-12-06 ENCOUNTER — Telehealth: Payer: Self-pay | Admitting: Family Medicine

## 2010-12-06 NOTE — Telephone Encounter (Signed)
Wants to talk to nurse about some sexual problems she is having.

## 2010-12-06 NOTE — Telephone Encounter (Signed)
agreed

## 2010-12-06 NOTE — Telephone Encounter (Signed)
Pt states that she is having pain with intercourse, states that it "feels like razor blades are cutting her."  Pt agreeable to appt .Victoria Powers, Victoria Powers

## 2010-12-11 ENCOUNTER — Ambulatory Visit: Payer: Medicaid Other | Admitting: Family Medicine

## 2011-01-09 ENCOUNTER — Other Ambulatory Visit (HOSPITAL_COMMUNITY)
Admission: RE | Admit: 2011-01-09 | Discharge: 2011-01-09 | Disposition: A | Discharge status: Home / Self Care | Payer: BC Managed Care – PPO | Source: Ambulatory Visit | Attending: Family Medicine | Admitting: Family Medicine

## 2011-01-09 ENCOUNTER — Encounter: Payer: Self-pay | Admitting: Family Medicine

## 2011-01-09 ENCOUNTER — Ambulatory Visit (INDEPENDENT_AMBULATORY_CARE_PROVIDER_SITE_OTHER): Payer: BC Managed Care – PPO | Admitting: Family Medicine

## 2011-01-09 VITALS — BP 133/83 | HR 89 | Ht 70.5 in | Wt 262.0 lb

## 2011-01-09 DIAGNOSIS — Z124 Encounter for screening for malignant neoplasm of cervix: Secondary | ICD-10-CM

## 2011-01-09 DIAGNOSIS — I10 Essential (primary) hypertension: Secondary | ICD-10-CM

## 2011-01-09 DIAGNOSIS — E669 Obesity, unspecified: Secondary | ICD-10-CM

## 2011-01-09 DIAGNOSIS — N760 Acute vaginitis: Secondary | ICD-10-CM

## 2011-01-09 DIAGNOSIS — Z01419 Encounter for gynecological examination (general) (routine) without abnormal findings: Secondary | ICD-10-CM | POA: Insufficient documentation

## 2011-01-09 DIAGNOSIS — F17200 Nicotine dependence, unspecified, uncomplicated: Secondary | ICD-10-CM

## 2011-01-09 DIAGNOSIS — Z0000 Encounter for general adult medical examination without abnormal findings: Secondary | ICD-10-CM

## 2011-01-09 MED ORDER — FLUCONAZOLE 150 MG PO TABS: 150.0000 mg | ORAL_TABLET | Freq: Every day | ORAL | Status: DC

## 2011-01-09 NOTE — Assessment & Plan Note (Signed)
At goal no changes at this time. No labs needed would check in 1 year.

## 2011-01-09 NOTE — Assessment & Plan Note (Signed)
Still making the changes still smoking gave tips and will help in anyway needed. F/u in 1 month.

## 2011-01-09 NOTE — Assessment & Plan Note (Signed)
Pt had discharge today with likely yeast, suppression therapy did help in the past will do again and see if it improves. Told pt that she may also improve with lactobacillus.

## 2011-01-09 NOTE — Patient Instructions (Addendum)
Good to see you. I when she to take one Diflucan 3 out of every 4 weeks of the month. Started one week before your period, the week of your period and the week after your period. Your blood pressure is good and we do not need labs at this time. I when she to start thinking of a diet as well as an exercise program. I when she to attempt to get at least 30 minutes of exercise most days of the week and try to decrease the amount of sweets as well as sugary beverages. If you're still having trouble come back and see me in one to 2 months and we can discuss labs as well as other regimens.

## 2011-01-09 NOTE — Progress Notes (Signed)
  Subjective:    Patient ID: Victoria Powers, female    DOB: January 18, 1981, 30 y.o.   MRN: [id number]  HPI 30 yo female here for cpe 1. Hypertension Blood pressure at home:not checking regularly. Blood pressure today: 133/83 Taking Meds:yes Side effects:no ROS: Denies headache visual changes nausea, vomiting, chest pain or abdominal pain or shortness of breath.  Chronic yeast infection:  Pt has had trouble for sometime and was on suppressive therapy. Pt though has been off of it for sometime now and till is having the problem.  Pt seems to be having it more around her periods and then when treated then would get some BV infection and need to be treated.  Pt is having some discharge now as well.  Pt would like to have a pap smear to day as well Tobacco abuse-  Pt has been smoking for some time. Trying to quit but not ready to set a quit date and wants to do it cold [place].   Obesity- Pt has had trouble with weight and would like some help.  At this time not trying any type of diet drinks a lot of fruit juices and sugary drinks, and not exercising at all. Pt blames her kids.   Review of Systems Denies fever, chills, nausea vomiting abdominal pain, dysuria, chest pain, shortness of breath dyspnea on exertion or numbness in extremities Past medical history, social, surgical and family history all reviewed.      Objective:   Physical Exam General: NAD CV: RRR no murmur Pul: CTAB Abd: BS +, NT, ND GU: vulvar appears healthy and normal with no lesions; vaginal no erythema/tenderness; milky white discharge in posterior fornix and cervical os; mild spotting at  os.     Assessment & Plan:

## 2011-01-09 NOTE — Assessment & Plan Note (Signed)
Pt is very overweight.  Encourage pt to make simple changes like de voiding her diet from sugary drinks and high calorie low energy foods. Encouraged exercise and will follow up in 1 month.  At that time will consider checking thyroid if needed.

## 2011-01-10 ENCOUNTER — Telehealth: Payer: Self-pay | Admitting: Family Medicine

## 2011-01-10 NOTE — Telephone Encounter (Signed)
Victoria Powers is calling because the Rx for her Ibuprofen was missing yesterday,

## 2011-01-11 MED ORDER — IBUPROFEN 800 MG PO TABS: 800.0000 mg | ORAL_TABLET | Freq: Three times a day (TID) | ORAL | Status: DC | PRN

## 2011-01-11 NOTE — Telephone Encounter (Signed)
Done

## 2011-04-15 ENCOUNTER — Other Ambulatory Visit: Payer: Self-pay | Admitting: Family Medicine

## 2011-04-15 NOTE — Telephone Encounter (Signed)
Refill request

## 2011-04-24 ENCOUNTER — Ambulatory Visit: Payer: BC Managed Care – PPO | Admitting: Family Medicine

## 2011-04-24 ENCOUNTER — Ambulatory Visit (INDEPENDENT_AMBULATORY_CARE_PROVIDER_SITE_OTHER): Payer: Medicare HMO | Admitting: Family Medicine

## 2011-04-24 ENCOUNTER — Other Ambulatory Visit (HOSPITAL_COMMUNITY)
Admission: RE | Admit: 2011-04-24 | Discharge: 2011-04-24 | Disposition: A | Discharge status: Home / Self Care | Payer: Medicare HMO | Source: Ambulatory Visit | Attending: Family Medicine | Admitting: Family Medicine

## 2011-04-24 ENCOUNTER — Encounter: Payer: Self-pay | Admitting: Family Medicine

## 2011-04-24 DIAGNOSIS — B9689 Other specified bacterial agents as the cause of diseases classified elsewhere: Secondary | ICD-10-CM

## 2011-04-24 DIAGNOSIS — F329 Major depressive disorder, single episode, unspecified: Secondary | ICD-10-CM

## 2011-04-24 DIAGNOSIS — R7309 Other abnormal glucose: Secondary | ICD-10-CM

## 2011-04-24 DIAGNOSIS — Z113 Encounter for screening for infections with a predominantly sexual mode of transmission: Secondary | ICD-10-CM | POA: Insufficient documentation

## 2011-04-24 DIAGNOSIS — I10 Essential (primary) hypertension: Secondary | ICD-10-CM

## 2011-04-24 DIAGNOSIS — N760 Acute vaginitis: Secondary | ICD-10-CM

## 2011-04-24 DIAGNOSIS — R739 Hyperglycemia, unspecified: Secondary | ICD-10-CM | POA: Insufficient documentation

## 2011-04-24 DIAGNOSIS — A499 Bacterial infection, unspecified: Secondary | ICD-10-CM

## 2011-04-24 LAB — COMPREHENSIVE METABOLIC PANEL
ALT: 19 U/L (ref 0–35)
CO2: 26 mEq/L (ref 19–32)
Calcium: 9.6 mg/dL (ref 8.4–10.5)
Chloride: 104 mEq/L (ref 96–112)
Creat: 0.77 mg/dL (ref 0.50–1.10)
Glucose, Bld: 85 mg/dL (ref 70–99)
Sodium: 139 mEq/L (ref 135–145)
Total Bilirubin: 0.3 mg/dL (ref 0.3–1.2)
Total Protein: 7.6 g/dL (ref 6.0–8.3)

## 2011-04-24 LAB — POCT WET PREP (WET MOUNT)

## 2011-04-24 LAB — TSH: TSH: 0.938 u[IU]/mL (ref 0.350–4.500)

## 2011-04-24 LAB — POCT GLYCOSYLATED HEMOGLOBIN (HGB A1C): Hemoglobin A1C: 5.7

## 2011-04-24 MED ORDER — METRONIDAZOLE 500 MG PO TABS: 500.0000 mg | ORAL_TABLET | Freq: Two times a day (BID) | ORAL | Status: DC

## 2011-04-24 MED ORDER — LACTINEX PO CHEW: 2.0000 | CHEWABLE_TABLET | Freq: Three times a day (TID) | ORAL | Status: AC

## 2011-04-24 MED ORDER — FLUCONAZOLE 150 MG PO TABS: 150.0000 mg | ORAL_TABLET | Freq: Every day | ORAL | Status: DC

## 2011-04-24 NOTE — Patient Instructions (Signed)
I'm sorry this continues to give you problems. I will call you when I get the lab results back. I am sending you in Flagyl again for the next 7 days. I also when she to take your Diflucan daily for the next week and see if that helps. I when she to try the eating yogurt daily. I also want you to try some lactobacillus over-the-counter. I think this could help  Bacterial Vaginosis Bacterial vaginosis (BV) is a vaginal infection where the normal balance of bacteria in the vagina is disrupted. The normal balance is then replaced by an overgrowth of certain bacteria. There are several different kinds of bacteria that can cause BV. BV is the most common vaginal infection in women of childbearing age. CAUSES   The cause of BV is not fully understood. BV develops when there is an increase or imbalance of harmful bacteria.   Some activities or behaviors can upset the normal balance of bacteria in the vagina and put women at increased risk including:   Having a new sex partner or multiple sex partners.   Douching.   Using an intrauterine device (IUD) for contraception.   It is not clear what role sexual activity plays in the development of BV. However, women that have never had sexual intercourse are rarely infected with BV.  Women do not get BV from toilet seats, bedding, swimming pools or from touching objects around them.  SYMPTOMS   Grey vaginal discharge.   A fish-like odor with discharge, especially after sexual intercourse.   Itching or burning of the vagina and vulva.   Burning or pain with urination.   Some women have no signs or symptoms at all.  DIAGNOSIS  Your caregiver must examine the vagina for signs of BV. Your caregiver will perform lab tests and look at the sample of vaginal fluid through a microscope. They will look for bacteria and abnormal cells (clue cells), a pH test higher than 4.5, and a positive amine test all associated with BV.  RISKS AND COMPLICATIONS    Pelvic inflammatory disease (PID).   Infections following gynecology surgery.   Developing HIV.   Developing herpes virus.  TREATMENT  Sometimes BV will clear up without treatment. However, all women with symptoms of BV should be treated to avoid complications, especially if gynecology surgery is planned. Female partners generally do not need to be treated. However, BV may spread between female sex partners so treatment is helpful in preventing a recurrence of BV.   BV may be treated with antibiotics. The antibiotics come in either pill or vaginal cream forms. Either can be used with nonpregnant or pregnant women, but the recommended dosages differ. These antibiotics are not harmful to the baby.   BV can recur after treatment. If this happens, a second round of antibiotics will often be prescribed.   Treatment is important for pregnant women. If not treated, BV can cause a premature delivery, especially for a pregnant woman who had a premature birth in the past. All pregnant women who have symptoms of BV should be checked and treated.   For chronic reoccurrence of BV, treatment with a type of prescribed gel vaginally twice a week is helpful.  HOME CARE INSTRUCTIONS   Finish all medication as directed by your caregiver.   Do not have sex until treatment is completed.   Tell your sexual partner that you have a vaginal infection. They should see their caregiver and be treated if they have problems, such as a  mild rash or itching.   Practice safe sex. Use condoms. Only have 1 sex partner.  PREVENTION  Basic prevention steps can help reduce the risk of upsetting the natural balance of bacteria in the vagina and developing BV:  Do not have sexual intercourse (be abstinent).   Do not douche.   Use all of the medicine prescribed for treatment of BV, even if the signs and symptoms go away.   Tell your sex partner if you have BV. That way, they can be treated, if needed, to prevent  reoccurrence.  SEEK MEDICAL CARE IF:   Your symptoms are not improving after 3 days of treatment.   You have increased discharge, pain, or fever.  MAKE SURE YOU:   Understand these instructions.   Will watch your condition.   Will get help right away if you are not doing well or get worse.  FOR MORE INFORMATION  Division of STD Prevention (DSTDP), Centers for Disease Control and Prevention: SolutionApps.co.za American Social Health Association (ASHA): www.ashastd.org  Document Released: 02/18/2005 Document Revised: 10/31/2010 Document Reviewed: 08/11/2008 [facility] Patient Information 2012 [place], [place].

## 2011-04-25 NOTE — Progress Notes (Signed)
  Subjective:    Patient ID: CHRISHAWNA FARINA, female    DOB: 01/15/1981, 31 y.o.   MRN: [id number]  HPI 31 year old female with significant past medical history for recurrent bacterial vaginosis and yeast infections. Patient states that she is having a recurrence at this time. Patient has been taking Diflucan weekly but sometimes misses this dose could she is concerned for her liver. In addition this patient continues to use the MetroGel on a fairly regular basis. Patient states that she is still having significant amount of discharge white in nature some motor and does have some vaginal itchiness. Patient has not been sexually active for greater than 2 months and at that time did use protection. Patient does have a history of sexually transmitted disease in the past. Patient denies any fevers, chills, abdominal pain diarrhea or constipation.   Review of Systems As stated above    Objective:   Physical Exam General: No apparent distress obese female Cardiovascular: Regular rhythm no murmur Pulmonary: Clear to auscultation bilaterally GU exam: External exam normal internal exam vaginal tissue seems to be pink moist no erythema patient does have significant white discharge no signs of yeast does have a foul smell. Bimanual exam cervix is nontender no masses appreciated.     Assessment & Plan:

## 2011-04-25 NOTE — Assessment & Plan Note (Signed)
Exam is consistent with clue cells on microscopic exam. At this time and later with a week of Flagyl orally as well as one week of Diflucan. We will also get patient's labs for thyroid as well as diabetes with patient having a history of hyperglycemia in the past. In addition to this patient given home remedies to attempt to try. Continue same regimen of prophylactic treatment.

## 2011-12-06 ENCOUNTER — Other Ambulatory Visit: Payer: Self-pay | Admitting: Family Medicine

## 2011-12-16 ENCOUNTER — Other Ambulatory Visit: Payer: Self-pay | Admitting: Family Medicine

## 2011-12-26 ENCOUNTER — Telehealth: Payer: Self-pay | Admitting: *Deleted

## 2011-12-26 DIAGNOSIS — B9689 Other specified bacterial agents as the cause of diseases classified elsewhere: Secondary | ICD-10-CM

## 2011-12-26 DIAGNOSIS — N760 Acute vaginitis: Secondary | ICD-10-CM

## 2011-12-26 MED ORDER — METRONIDAZOLE 0.75 % VA GEL: 1.0000 | VAGINAL | Status: AC

## 2011-12-26 MED ORDER — FLUCONAZOLE 150 MG PO TABS: 150.0000 mg | ORAL_TABLET | Freq: Every day | ORAL | Status: DC

## 2011-12-26 NOTE — Telephone Encounter (Signed)
Will refill x 2 months, but patient has not been seen in office since February, she needs to come in for an office visit if this problem is still going on.  Please notify her.   Skeeter Sheard 12/26/2011

## 2011-12-26 NOTE — Telephone Encounter (Signed)
[facility] calling requesting refills for Victoria Powers.  She needs Diflucan and Metrogel.  Pharmacy states Dr. Katrinka Blazing prescribed these medications as a standing order for chronic yeast and vaginitis infection.  Will forward to Dr. Lula Olszewski for review.  Ileana Ladd

## 2011-12-27 NOTE — Telephone Encounter (Signed)
Pt informed.  States that she will make an appt. Jung Ingerson, Maryjo Rochester

## 2012-01-09 ENCOUNTER — Ambulatory Visit (INDEPENDENT_AMBULATORY_CARE_PROVIDER_SITE_OTHER): Payer: Managed Care, Other (non HMO) | Admitting: Family Medicine

## 2012-01-09 ENCOUNTER — Encounter: Payer: Self-pay | Admitting: Family Medicine

## 2012-01-09 VITALS — BP 111/71 | HR 96 | Temp 98.2°F | Ht 70.5 in | Wt 259.0 lb

## 2012-01-09 DIAGNOSIS — M542 Cervicalgia: Secondary | ICD-10-CM

## 2012-01-09 DIAGNOSIS — M25569 Pain in unspecified knee: Secondary | ICD-10-CM

## 2012-01-09 DIAGNOSIS — F17200 Nicotine dependence, unspecified, uncomplicated: Secondary | ICD-10-CM

## 2012-01-09 MED ORDER — CYCLOBENZAPRINE HCL 5 MG PO TABS: 5.0000 mg | ORAL_TABLET | Freq: Every evening | ORAL | Status: DC | PRN

## 2012-01-09 NOTE — Assessment & Plan Note (Signed)
I suspect this is patello-femoral syndrome, and discussed this with patient, gave hand out with home exercise program for rehab of this, continue ibuprofen as needed.

## 2012-01-09 NOTE — Progress Notes (Signed)
PGY3 Addendum to Student Note:   Mardell comes in with neck and knee pain  Neck- no injury, has been going on a long time, no weakness, numbness or tingling in neck.    Knee- used to dance, had an injury a long time was told "ligaments" were torn.  + swelling, pain, no giving way.  No new injury.   Tobacco abuse- patient continues to smoke, knows it is bad to her, did not know it can contribute to neck and back pain.   BP 111/71  Pulse 96  Temp 98.2 F (36.8 C) (Oral)  Ht 5' 10.5" (1.791 m)  Wt 259 lb (117.482 kg)  BMI 36.64 kg/m2  LMP 01/02/2012 General appearance: alert, cooperative and no distress Neck: Negative spurling's Full neck range of motion Grip strength and sensation normal in bilateral hands Strength good C4 to T1 distribution No sensory change to C4 to T1 Reflexes normal + TTP right lateral neck muscles  Knee: Normal to inspection with no erythema or effusion or obvious bony abnormalities. Palpation reveals tenderness at medial joint line and with patellar compression ROM normal in flexion and extension and lower leg rotation. Ligaments with solid consistent endpoints including ACL, PCL, LCL, MCL.

## 2012-01-09 NOTE — Patient Instructions (Signed)
I am sorry your neck and knee are hurting.  Please see the attached sheets with physical therapy exercises for both. Please do these exercises twice a day every day.  Please also try the flexeril at beddtime for the neck muscle spasm, and continue ibuprofen as needed.

## 2012-01-09 NOTE — Progress Notes (Signed)
Subjective:     Patient ID: Victoria Powers, female   DOB: February 23, 1981, 31 y.o.   MRN: [id number]  HPI Victoria Powers is a 31 yo female presenting with neck and knee pain.   She says that the neck pain has been going on for years; however, it comes and goes. This time, the neck pain came on when she woke up this past weekend and seems to be lasting longer than usual. She also complains of right sided head pain along with the neck pain that is only completely relieved by pressure on a "knot" she can feel on the right side of her neck near the AA joint. She has also been taking 800 mg ibuprofen for the pain, which she says does not completely relieve the pain but tends to help a little bit. She has her family members massage the knot, which tends to help but the pain comes back as soon as they stop.   Her knee pain has also been going on for years. She was told when she was younger that she tore some ligaments when she was dancing. She has difficulty sitting for long periods of time because having her knee bent worsens the pain. Stretching out her leg and moving around tends to help the pain. She also notices that cold weather and weather changes in general make the pain worse. Again, the 800 mg ibuprofen helps the pain somewhat but does not completely relieve the pain.   She denies any fevers. Denies any weakness of her extremities.   Review of Systems See HPI above    Objective:   Physical Exam BP 111/71  Pulse 96  Temp 98.2 F (36.8 C) (Oral)  Ht 5' 10.5" (1.791 m)  Wt 259 lb (117.482 kg)  BMI 36.64 kg/m2  LMP 01/02/2012 General appearance: alert, cooperative and no distress Head: Normocephalic, without obvious abnormality, atraumatic Ears: Ear wax impacting ear canals bilaterally Neck: no adenopathy, supple, symmetrical, trachea midline and appropriate and symmetrical active and passive ROM cervical spine, tender, boggy area near AA joint on R side Lungs: clear to auscultation  bilaterally Heart: regular rate and rhythm, S1, S2 normal, no murmur, click, rub or gallop Extremities: extremities normal, atraumatic, no cyanosis or edema, R knee + crepitation under patella with leg extension, + tenderness R medial joint line Pulses: 2+ and symmetric Skin: Skin color, texture, turgor normal. No rashes or lesions Neurologic: Alert and oriented X 3, normal strength and tone. Normal symmetric reflexes. Normal coordination and gait    Assessment:     Cervical Muscle spasm Patellofemoral syndrome    Plan:     1) Discussed with the patient the need for either PT or exercises, stretches and massage she can do on her own at home for both her cervical muscle spasm and patellofemoral syndrome. She opted to try exercises at home because she does not feel she has time for physical therapy. We gave her handouts for exercises for both her neck and knee.  2) Patient was told she can continue taking ibuprofen as needed for pain.  3) Patient can take flexeril at night for muscle spasm.

## 2012-01-09 NOTE — Assessment & Plan Note (Signed)
No neurological symptoms on history or exam- feel this is a muscle spasm.  Gave hand out with home exercise program, rx flexeril, continue ibuprofen.

## 2012-01-09 NOTE — Assessment & Plan Note (Signed)
Discussed smoking cessation as important for overall health, including spine.  Patient in contemplative stage of quitting.

## 2012-02-12 ENCOUNTER — Ambulatory Visit (INDEPENDENT_AMBULATORY_CARE_PROVIDER_SITE_OTHER): Payer: Managed Care, Other (non HMO) | Admitting: Family Medicine

## 2012-02-12 ENCOUNTER — Encounter: Payer: Self-pay | Admitting: Family Medicine

## 2012-02-12 ENCOUNTER — Other Ambulatory Visit (HOSPITAL_COMMUNITY)
Admission: RE | Admit: 2012-02-12 | Discharge: 2012-02-12 | Disposition: A | Discharge status: Home / Self Care | Payer: Managed Care, Other (non HMO) | Source: Ambulatory Visit | Attending: Family Medicine | Admitting: Family Medicine

## 2012-02-12 VITALS — BP 125/77 | HR 85 | Temp 98.3°F | Ht 70.5 in | Wt 263.0 lb

## 2012-02-12 DIAGNOSIS — Z01419 Encounter for gynecological examination (general) (routine) without abnormal findings: Secondary | ICD-10-CM

## 2012-02-12 DIAGNOSIS — Z1151 Encounter for screening for human papillomavirus (HPV): Secondary | ICD-10-CM | POA: Insufficient documentation

## 2012-02-12 DIAGNOSIS — Z7251 High risk heterosexual behavior: Secondary | ICD-10-CM

## 2012-02-12 DIAGNOSIS — N760 Acute vaginitis: Secondary | ICD-10-CM

## 2012-02-12 DIAGNOSIS — Z124 Encounter for screening for malignant neoplasm of cervix: Secondary | ICD-10-CM

## 2012-02-12 DIAGNOSIS — Z113 Encounter for screening for infections with a predominantly sexual mode of transmission: Secondary | ICD-10-CM | POA: Insufficient documentation

## 2012-02-12 DIAGNOSIS — N943 Premenstrual tension syndrome: Secondary | ICD-10-CM

## 2012-02-12 DIAGNOSIS — F3281 Premenstrual dysphoric disorder: Secondary | ICD-10-CM

## 2012-02-12 LAB — POCT WET PREP (WET MOUNT): Clue Cells Wet Prep Whiff POC: POSITIVE

## 2012-02-12 LAB — RPR

## 2012-02-12 MED ORDER — TERCONAZOLE 0.8 % VA CREA: 1.0000 | TOPICAL_CREAM | Freq: Every day | VAGINAL | Status: DC

## 2012-02-12 NOTE — Assessment & Plan Note (Signed)
Recurrent problems, on suppressive therapy.  Will try terazol vaginal cream in case of resistant yeast.  Will refer to GYN for further management.

## 2012-02-12 NOTE — Assessment & Plan Note (Signed)
Minimal improvement on ibuprofen.  No abnormalities on exam. Will refer to GYN for management.

## 2012-02-12 NOTE — Progress Notes (Signed)
  Subjective:    Patient ID: Victoria Powers, female    DOB: 12-Dec-1980, 31 y.o.   MRN: [id number]  HPI  Victoria Powers comes in for her well woman exam.  She says she has had several abnormal paps in the past and one colposcopy with biopsy but then did not need any further intervention.    She complains of persistent vaginal discharge and irritation.  She has been on metrogel and diflucan for suppression, which helps a little but has not cleared up her discomfort.  She is very tired of the recurrent infections and says she is constantly having to deal with the discharge and discomfort and it is interfering with her life.   She also complains of significant pelvic pain and discomfort before her periods.  She says she takes ibuprofen but the pain is very bad.  She says she "wants everything removed."   Past Medical History  Diagnosis Date  . Hypertension   . Allergy    Family History  Problem Relation Age of Onset  . Diabetes Mother   . Hypertension Mother   . Diabetes Father   . Hyperlipidemia Father   . Hypertension Father   . Cancer Maternal Aunt     Pancreatic    History  Substance Use Topics  . Smoking status: Current Every Day Smoker -- 0.5 packs/day  . Smokeless tobacco: Never Used  . Alcohol Use: Yes   Review of Systems Pertinent items in HPI    Objective:   Physical Exam BP 125/77  Pulse 85  Temp 98.3 F (36.8 C) (Oral)  Ht 5' 10.5" (1.791 m)  Wt 263 lb (119.296 kg)  BMI 37.20 kg/m2  LMP 01/29/2012 General appearance: alert, cooperative and no distress Lungs: clear to auscultation bilaterally Heart: regular rate and rhythm, S1, S2 normal, no murmur, click, rub or gallop Pelvic: cervix normal in appearance, external genitalia normal, no adnexal masses or tenderness, no cervical motion tenderness, rectovaginal septum normal and uterus normal size, shape, and consistency, Vagina with thin white discharge.  Extremities: extremities normal, atraumatic, no cyanosis or  edema       Assessment & Plan:

## 2012-02-12 NOTE — Assessment & Plan Note (Signed)
Pap done today, patient declines flu shot.

## 2012-02-12 NOTE — Patient Instructions (Signed)
It was good to see you.  I will make a referral for you to see [facility] for your vaginal issues and heavy cramping.    I will send you a letter with your lab results, or call you if anything is abnormal.

## 2012-02-13 ENCOUNTER — Encounter: Payer: Self-pay | Admitting: Obstetrics & Gynecology

## 2012-02-18 ENCOUNTER — Encounter: Payer: Self-pay | Admitting: Family Medicine

## 2012-03-09 ENCOUNTER — Encounter: Payer: Managed Care, Other (non HMO) | Admitting: Obstetrics & Gynecology

## 2012-03-30 ENCOUNTER — Other Ambulatory Visit: Payer: Self-pay | Admitting: Family Medicine

## 2012-04-03 ENCOUNTER — Telehealth: Payer: Self-pay | Admitting: Family Medicine

## 2012-04-03 ENCOUNTER — Encounter: Payer: Self-pay | Admitting: Obstetrics & Gynecology

## 2012-04-03 ENCOUNTER — Ambulatory Visit (INDEPENDENT_AMBULATORY_CARE_PROVIDER_SITE_OTHER): Payer: Medicaid Other | Admitting: Obstetrics & Gynecology

## 2012-04-03 ENCOUNTER — Other Ambulatory Visit: Payer: Self-pay | Admitting: Family Medicine

## 2012-04-03 VITALS — BP 140/86 | HR 88 | Temp 97.9°F | Resp 20 | Ht 70.0 in | Wt 264.7 lb

## 2012-04-03 DIAGNOSIS — A499 Bacterial infection, unspecified: Secondary | ICD-10-CM

## 2012-04-03 DIAGNOSIS — Z8619 Personal history of other infectious and parasitic diseases: Secondary | ICD-10-CM

## 2012-04-03 DIAGNOSIS — N943 Premenstrual tension syndrome: Secondary | ICD-10-CM

## 2012-04-03 DIAGNOSIS — F3281 Premenstrual dysphoric disorder: Secondary | ICD-10-CM

## 2012-04-03 DIAGNOSIS — B9689 Other specified bacterial agents as the cause of diseases classified elsewhere: Secondary | ICD-10-CM

## 2012-04-03 DIAGNOSIS — N898 Other specified noninflammatory disorders of vagina: Secondary | ICD-10-CM

## 2012-04-03 DIAGNOSIS — N760 Acute vaginitis: Secondary | ICD-10-CM

## 2012-04-03 MED ORDER — FLUOXETINE HCL 20 MG PO TABS: ORAL_TABLET | ORAL | Status: DC

## 2012-04-03 MED ORDER — VALACYCLOVIR HCL 1 G PO TABS: 1000.0000 mg | ORAL_TABLET | Freq: Every day | ORAL | Status: DC

## 2012-04-03 MED ORDER — IBUPROFEN 800 MG PO TABS: 800.0000 mg | ORAL_TABLET | Freq: Three times a day (TID) | ORAL | Status: DC | PRN

## 2012-04-03 NOTE — Telephone Encounter (Signed)
Pt was here in December and needs refill on her Ibuprofen today- her insurance runs out today and she uses this for her menses pain.  pls let her know

## 2012-04-03 NOTE — Patient Instructions (Addendum)
Do NOT use any scented anything!!! Take Prozac for the 2 weeks prior to your period every month. Take Valtrex daily. Follow up in 3-4 weeks.  Bacterial Vaginosis Bacterial vaginosis (BV) is a vaginal infection where the normal balance of bacteria in the vagina is disrupted. The normal balance is then replaced by an overgrowth of certain bacteria. There are several different kinds of bacteria that can cause BV. BV is the most common vaginal infection in women of childbearing age. CAUSES   The cause of BV is not fully understood. BV develops when there is an increase or imbalance of harmful bacteria.  Some activities or behaviors can upset the normal balance of bacteria in the vagina and put women at increased risk including:  Having a new sex partner or multiple sex partners.  Douching.  Using an intrauterine device (IUD) for contraception.  It is not clear what role sexual activity plays in the development of BV. However, women that have never had sexual intercourse are rarely infected with BV. Women do not get BV from toilet seats, bedding, swimming pools or from touching objects around them.  SYMPTOMS   Grey vaginal discharge.  A fish-like odor with discharge, especially after sexual intercourse.  Itching or burning of the vagina and vulva.  Burning or pain with urination.  Some women have no signs or symptoms at all. DIAGNOSIS  Your caregiver must examine the vagina for signs of BV. Your caregiver will perform lab tests and look at the sample of vaginal fluid through a microscope. They will look for bacteria and abnormal cells (clue cells), a pH test higher than 4.5, and a positive amine test all associated with BV.  RISKS AND COMPLICATIONS   Pelvic inflammatory disease (PID).  Infections following gynecology surgery.  Developing HIV.  Developing herpes virus. TREATMENT  Sometimes BV will clear up without treatment. However, all women with symptoms of BV should be  treated to avoid complications, especially if gynecology surgery is planned. Female partners generally do not need to be treated. However, BV may spread between female sex partners so treatment is helpful in preventing a recurrence of BV.   BV may be treated with antibiotics. The antibiotics come in either pill or vaginal cream forms. Either can be used with nonpregnant or pregnant women, but the recommended dosages differ. These antibiotics are not harmful to the baby.  BV can recur after treatment. If this happens, a second round of antibiotics will often be prescribed.  Treatment is important for pregnant women. If not treated, BV can cause a premature delivery, especially for a pregnant woman who had a premature birth in the past. All pregnant women who have symptoms of BV should be checked and treated.  For chronic reoccurrence of BV, treatment with a type of prescribed gel vaginally twice a week is helpful. HOME CARE INSTRUCTIONS   Finish all medication as directed by your caregiver.  Do not have sex until treatment is completed.  Tell your sexual partner that you have a vaginal infection. They should see their caregiver and be treated if they have problems, such as a mild rash or itching.  Practice safe sex. Use condoms. Only have 1 sex partner. PREVENTION  Basic prevention steps can help reduce the risk of upsetting the natural balance of bacteria in the vagina and developing BV:  Do not have sexual intercourse (be abstinent).  Do not douche.  Use all of the medicine prescribed for treatment of BV, even if the signs and symptoms  go away.  Tell your sex partner if you have BV. That way, they can be treated, if needed, to prevent reoccurrence. SEEK MEDICAL CARE IF:   Your symptoms are not improving after 3 days of treatment.  You have increased discharge, pain, or fever. MAKE SURE YOU:   Understand these instructions.  Will watch your condition.  Will get help right away if  you are not doing well or get worse. FOR MORE INFORMATION  Division of STD Prevention (DSTDP), Centers for Disease Control and Prevention: [employer] American Social Health Association (ASHA): www.ashastd.org  Document Released: 02/18/2005 Document Revised: 05/13/2011 Document Reviewed: 08/11/2008 [facility] Patient Information 2013 [place], [place].

## 2012-04-03 NOTE — Progress Notes (Signed)
  Subjective:    Patient ID: Victoria Powers, female    DOB: 02/12/1981, 32 y.o.   MRN: [id number]  HPI Victoria Powers comes in today for evaluation of vaginal discharge and PMDD.  She reports a long history of repeated BV and yeast infections, required an extensive daily regimen of vaginal gels, probiotics and frequent diflucan.  She reports improvement in yeast infection with probiotics, but no change in BV.  The discharge is described as "bad smelling" and copious requiring a daily pantyliner.  She sometimes will have white cheesy looking discharge as well.  She does not identify any triggers for the discharge, but does state that was worse after sex previously.  Has a history of genital herpes, but she reports years since her last outbreak.  Using dove sensitive soap without rags in the shower; wearing 100% cotton underwear, using unscented pantyliners and pads, uses dedicated clippers to dry shave pubic hair.  Does eat a lot of dairy products.   Also reports years of heavy bleeding, cramping, and fatigue/irritability associated with her menstrual cycle.  She feels that this is slowly getting worse as she gets older.  The fatigue and irritability typically start about 10 days before her cycle.  Cycle lasts 4-7 days and has severe cramping, low back pain and heavy bleeding.   She had tubal following her last pregnancy.  Has not tried any birth control method or SSRIs for this.  She was prescribed prozac in the past, but could not bring herself to take an anti-depressent.   Reviewed and updated history as appropriate in EPIC - LMP 03/21/2012 - hypertension well controlled with lisinopril-HCTZ tablet - BTL - no known family history of blood clots - no history of migraine - current smoker, not interested in quitting at this time  Review of Systems See HPI    Objective:   Physical Exam BP 140/86  Pulse 88  Temp 97.9 F (36.6 C) (Oral)  Resp 20  Ht 5\' 10"  (1.778 m)  Wt 264 lb 11.2 oz  (120.067 kg)  BMI 37.98 kg/m2  LMP 03/21/2012 Gen: alert, cooperative, NAD Pelvic: shaved pubic hair; small amount of white discharge in vagina     Assessment & Plan:  32 yo F with PMDD and recurrent BV infections/vaginal discharge  Vaginal Discharge: Reviewed conservative measures, including white cotton underwear, unscented pads and liners, unscented detergent, mild soap with no rag for washing, no douching.  Discussed potential for dietary changes, particularly removing dairy.  Also discussed possibility of HSV causing the recurrent BV.  After discussion, the patient will try suppressive therapy with Valtrex 1g daily.  She will stop all the gels and antibiotics.  Follow up in 3-4 weeks.  PMDD: Discussed options including OCPs, Mirena and SSRIs.  Reviewed risks and benefits of each; including blood clots for OCP with age and smoking status, increased BV with Mirena.  After discussion, the patient will try Prozac 20mg  daily for the 2 weeks prior to her period every month.  Follow up in 3-4 weeks.  Attestation of Attending Supervision of Resident: Evaluation and management procedures were performed by the [facility] Resident under my supervision.  I have seen and examined the patient, reviewed the resident's note and chart, and I agree with the management and plan.  Anibal Henderson, M.D. 04/03/2012 11:53 AM

## 2012-04-03 NOTE — Telephone Encounter (Signed)
Will page Dr.Chamberlain now.

## 2012-05-04 ENCOUNTER — Ambulatory Visit: Payer: Managed Care, Other (non HMO) | Admitting: Obstetrics & Gynecology

## 2012-06-23 ENCOUNTER — Other Ambulatory Visit: Payer: Self-pay | Admitting: Family Medicine

## 2012-06-28 ENCOUNTER — Other Ambulatory Visit: Payer: Self-pay | Admitting: Family Medicine

## 2012-06-29 ENCOUNTER — Telehealth: Payer: Self-pay | Admitting: Family Medicine

## 2012-06-29 NOTE — Telephone Encounter (Signed)
Patient is going to pick up her refills for the Lisinopril/HCTZ, which is for a 90 day supply, but she would like to know if it is possible to put 2 refills on that so that she will have enough for 6 months.

## 2012-07-02 MED ORDER — LISINOPRIL-HYDROCHLOROTHIAZIDE 20-12.5 MG PO TABS: 1.0000 | ORAL_TABLET | Freq: Every day | ORAL | Status: DC

## 2012-07-02 NOTE — Telephone Encounter (Signed)
6 month supply called in.

## 2012-07-17 ENCOUNTER — Other Ambulatory Visit: Payer: Self-pay | Admitting: Family Medicine

## 2012-07-23 ENCOUNTER — Encounter: Payer: Self-pay | Admitting: Obstetrics & Gynecology

## 2012-07-23 ENCOUNTER — Ambulatory Visit (INDEPENDENT_AMBULATORY_CARE_PROVIDER_SITE_OTHER): Payer: Medicaid Other | Admitting: Obstetrics & Gynecology

## 2012-07-23 VITALS — BP 129/86 | HR 81 | Ht 70.0 in | Wt 264.8 lb

## 2012-07-23 DIAGNOSIS — A499 Bacterial infection, unspecified: Secondary | ICD-10-CM

## 2012-07-23 DIAGNOSIS — N760 Acute vaginitis: Secondary | ICD-10-CM

## 2012-07-23 MED ORDER — METRONIDAZOLE 0.75 % VA GEL: 1.0000 | VAGINAL | Status: DC

## 2012-07-23 NOTE — Patient Instructions (Signed)
Bacterial Vaginosis Bacterial vaginosis (BV) is a vaginal infection where the normal balance of bacteria in the vagina is disrupted. The normal balance is then replaced by an overgrowth of certain bacteria. There are several different kinds of bacteria that can cause BV. BV is the most common vaginal infection in women of childbearing age. CAUSES   The cause of BV is not fully understood. BV develops when there is an increase or imbalance of harmful bacteria.  Some activities or behaviors can upset the normal balance of bacteria in the vagina and put women at increased risk including:  Having a new sex partner or multiple sex partners.  Douching.  Using an intrauterine device (IUD) for contraception.  It is not clear what role sexual activity plays in the development of BV. However, women that have never had sexual intercourse are rarely infected with BV. Women do not get BV from toilet seats, bedding, swimming pools or from touching objects around them.  SYMPTOMS   Grey vaginal discharge.  A fish-like odor with discharge, especially after sexual intercourse.  Itching or burning of the vagina and vulva.  Burning or pain with urination.  Some women have no signs or symptoms at all. DIAGNOSIS  Your caregiver must examine the vagina for signs of BV. Your caregiver will perform lab tests and look at the sample of vaginal fluid through a microscope. They will look for bacteria and abnormal cells (clue cells), a pH test higher than 4.5, and a positive amine test all associated with BV.  RISKS AND COMPLICATIONS   Pelvic inflammatory disease (PID).  Infections following gynecology surgery.  Developing HIV.  Developing herpes virus. TREATMENT  Sometimes BV will clear up without treatment. However, all women with symptoms of BV should be treated to avoid complications, especially if gynecology surgery is planned. Female partners generally do not need to be treated. However, BV may spread  between female sex partners so treatment is helpful in preventing a recurrence of BV.   BV may be treated with antibiotics. The antibiotics come in either pill or vaginal cream forms. Either can be used with nonpregnant or pregnant women, but the recommended dosages differ. These antibiotics are not harmful to the baby.  BV can recur after treatment. If this happens, a second round of antibiotics will often be prescribed.  Treatment is important for pregnant women. If not treated, BV can cause a premature delivery, especially for a pregnant woman who had a premature birth in the past. All pregnant women who have symptoms of BV should be checked and treated.  For chronic reoccurrence of BV, treatment with a type of prescribed gel vaginally twice a week is helpful. HOME CARE INSTRUCTIONS   Finish all medication as directed by your caregiver.  Do not have sex until treatment is completed.  Tell your sexual partner that you have a vaginal infection. They should see their caregiver and be treated if they have problems, such as a mild rash or itching.  Practice safe sex. Use condoms. Only have 1 sex partner. PREVENTION  Basic prevention steps can help reduce the risk of upsetting the natural balance of bacteria in the vagina and developing BV:  Do not have sexual intercourse (be abstinent).  Do not douche.  Use all of the medicine prescribed for treatment of BV, even if the signs and symptoms go away.  Tell your sex partner if you have BV. That way, they can be treated, if needed, to prevent reoccurrence. SEEK MEDICAL CARE IF:     Your symptoms are not improving after 3 days of treatment.  You have increased discharge, pain, or fever. MAKE SURE YOU:   Understand these instructions.  Will watch your condition.  Will get help right away if you are not doing well or get worse. FOR MORE INFORMATION  Division of STD Prevention (DSTDP), Centers for Disease Control and Prevention:  www.cdc.gov/std American Social Health Association (ASHA): www.ashastd.org  Document Released: 02/18/2005 Document Revised: 05/13/2011 Document Reviewed: 08/11/2008 ExitCare Patient Information 2014 [employer].  

## 2012-07-23 NOTE — Progress Notes (Signed)
Subjective:     Patient ID: Victoria Powers, female   DOB: 1980-07-31, 32 y.o.   MRN: [id number]  HPI Pt with h/o recurrent BV.  No sx at present but, pt concerned that she has to go through so much to keep sx under control.  She has not tried the Prozac because she is scared about the potential side effects.  She has been exercising and her PMDD sx are better controlled since she began her exercise program.       Review of Systems     Objective:   Physical Exampt in NAD BP 129/86  Pulse 81  Ht 5\' 10"  (1.778 m)  Wt 264 lb 12.8 oz (120.112 kg)  BMI 37.99 kg/m2  LMP 07/09/2012      Assessment:     Recurrent BV.  No sx at present.  Reviewed daily regimen and reinforced some of the exacerbating features.       Plan:     Metrogel biweekly (pt already using this way) Use all unscented pantyliners and pads Latex free condoms (pt encouraged to purchase her own condoms to be sure the correct ones are being utilized)

## 2012-07-23 NOTE — Progress Notes (Signed)
Patient did not take the Prozac for PMDD. She was scared to start it.

## 2012-07-26 ENCOUNTER — Other Ambulatory Visit: Payer: Self-pay | Admitting: Family Medicine

## 2012-08-18 ENCOUNTER — Ambulatory Visit (INDEPENDENT_AMBULATORY_CARE_PROVIDER_SITE_OTHER): Payer: Medicaid Other | Admitting: Family Medicine

## 2012-08-18 VITALS — BP 127/86 | HR 80 | Temp 98.3°F | Ht 70.0 in | Wt 264.0 lb

## 2012-08-18 DIAGNOSIS — M542 Cervicalgia: Secondary | ICD-10-CM

## 2012-08-18 DIAGNOSIS — F17200 Nicotine dependence, unspecified, uncomplicated: Secondary | ICD-10-CM

## 2012-08-18 DIAGNOSIS — J309 Allergic rhinitis, unspecified: Secondary | ICD-10-CM

## 2012-08-18 MED ORDER — IBUPROFEN 800 MG PO TABS: ORAL_TABLET | ORAL | Status: AC

## 2012-08-18 MED ORDER — TRAMADOL HCL 50 MG PO TABS: 50.0000 mg | ORAL_TABLET | Freq: Three times a day (TID) | ORAL | Status: DC | PRN

## 2012-08-18 MED ORDER — FLUTICASONE PROPIONATE 50 MCG/ACT NA SUSP: 2.0000 | Freq: Every day | NASAL | Status: AC

## 2012-08-18 MED ORDER — CETIRIZINE HCL 10 MG PO TABS: 10.0000 mg | ORAL_TABLET | Freq: Every day | ORAL | Status: AC

## 2012-08-18 NOTE — Patient Instructions (Signed)
I am sorry your neck is hurting you so badly.  Please go to [facility] for X-rays.  I will call you when I see the results.  - Ice your neck for 20 minutes (until numb) 2-3 times per day for about 1 week, then start using a heating pad on your neck for 20 minutes twice a day.  - Take the tramadol at bedtime, take the ibuprofen with meals - do the home exercise program twice a day - come back in one month if not better.

## 2012-08-18 NOTE — Assessment & Plan Note (Signed)
Suspect muscle spasm/stress related.  Discussed this with patient, but given chronicity and severity of pain, will obtain x-rays.  Rx for tramadol and ibuprofen.  Gave hand out with neck spasm home exercise programs, discussed heat and ice therapy.

## 2012-08-18 NOTE — Assessment & Plan Note (Signed)
Advised to quit, patient declines assistance today.

## 2012-08-18 NOTE — Progress Notes (Signed)
  Subjective:    Patient ID: Victoria Powers, female    DOB: 1981-02-24, 32 y.o.   MRN: [id number]  HPI:  Victoria Powers comes in with a few complaints:   Neck pain: chronic problem, worse lately.  On lateral R neck, constant, no injuries, has been going on for years.  No radiation, no weakness, numbness in R hand.  Pain in neck strap muscles, into base of skull.  Taking ibuprofen, which helps, but not doing exercises, heat or ice therapy, or taking any other medications.  Flexeril in the past did not help.    Allergies: Having nasal congestion and facial pressure.  Difficult to breath through her nose at night time.  No cough, fever, shortness of breath.  Not taking any medications for this.   Tobacco Abuse: smoking 1/4-1/2 ppd. Knows she should quit, not ready.   Past Medical History  Diagnosis Date  . Hypertension   . Allergy     History  Substance Use Topics  . Smoking status: Current Every Day Smoker -- 0.25 packs/day for 4 years  . Smokeless tobacco: Never Used  . Alcohol Use: Yes    Family History  Problem Relation Age of Onset  . Diabetes Mother   . Hypertension Mother   . Diabetes Father   . Hyperlipidemia Father   . Hypertension Father   . Cancer Maternal Aunt     Pancreatic      ROS Pertinent items in HPI    Objective:  Physical Exam:  BP 127/86  Pulse 80  Temp(Src) 98.3 F (36.8 C) (Oral)  Ht 5\' 10"  (1.778 m)  Wt 264 lb (119.75 kg)  BMI 37.88 kg/m2  LMP 08/06/2012 General appearance: alert, cooperative and no distress Neck: Negative spurling's Full neck range of motion Grip strength and sensation normal in bilateral hands Strength good C4 to T1 distribution No sensory change to C4 to T1 Reflexes normal      Assessment & Plan:

## 2012-08-18 NOTE — Assessment & Plan Note (Signed)
Rx for flonase and zyrtec, discussed symptomatic care.

## 2012-10-17 ENCOUNTER — Other Ambulatory Visit: Payer: Self-pay | Admitting: Family Medicine

## 2012-12-30 ENCOUNTER — Encounter: Payer: Self-pay | Admitting: Family Medicine

## 2012-12-30 ENCOUNTER — Ambulatory Visit (INDEPENDENT_AMBULATORY_CARE_PROVIDER_SITE_OTHER): Payer: Medicaid Other | Admitting: Family Medicine

## 2012-12-30 VITALS — BP 140/90 | HR 80 | Temp 98.6°F | Ht 70.0 in | Wt 262.0 lb

## 2012-12-30 DIAGNOSIS — I10 Essential (primary) hypertension: Secondary | ICD-10-CM

## 2012-12-30 DIAGNOSIS — T783XXA Angioneurotic edema, initial encounter: Secondary | ICD-10-CM

## 2012-12-30 MED ORDER — RANITIDINE HCL 150 MG PO TABS: 150.0000 mg | ORAL_TABLET | Freq: Two times a day (BID) | ORAL | Status: AC

## 2012-12-30 MED ORDER — METRONIDAZOLE 0.75 % VA GEL: VAGINAL | Status: DC

## 2012-12-30 MED ORDER — HYDROCHLOROTHIAZIDE 25 MG PO TABS: 25.0000 mg | ORAL_TABLET | Freq: Every day | ORAL | Status: DC

## 2012-12-30 MED ORDER — TRIAMCINOLONE ACETONIDE 0.1 % EX OINT: TOPICAL_OINTMENT | Freq: Two times a day (BID) | CUTANEOUS | Status: AC

## 2012-12-30 MED ORDER — OLOPATADINE HCL 0.2 % OP SOLN: 1.0000 [drp] | Freq: Two times a day (BID) | OPHTHALMIC | Status: AC | PRN

## 2012-12-30 NOTE — Assessment & Plan Note (Signed)
Concern that this could be from multiple etiology (ace-i, make up, food allergen). Will restart zyrtec and start ranitidine. Follow up in 1 week. Return to care if symptoms worsen or if SOB, throat or tongue swelling.   Stop ace-i today. No make up.

## 2012-12-30 NOTE — Patient Instructions (Addendum)
For the swelling of your lips  I want you to STOP the lisinopril  Avoid makeup and contacts until your symptoms have resolved  Take zyrtec and ranitidine to help reduce your body's reaction  For your eyes  I think you either had a viral conjunctivitis or an allergic conjunctivitis or contact dermatitis  Try the pataday drops  For your blood pressure  I have sent in a new medication (full dose hctz)  I want to see you next week to check in, if things worsen, I want to see you before then,  Dr. Durene Cal

## 2012-12-30 NOTE — Assessment & Plan Note (Signed)
Advised cessation. Patient not ready to quit.  

## 2012-12-30 NOTE — Assessment & Plan Note (Signed)
Mildly elevated. Need to stop lisinopril due to angioedema concerns. Maxed out hctz from 12.5 to 25. F/u 1 week. May need to add amlodipine.

## 2012-12-30 NOTE — Assessment & Plan Note (Signed)
Not using emollient's for hand's when or when not with patch of eczema. Advised regular use. Refilled triamcinolone ointment to be used 2 weeks max (previously this was the only therapy).

## 2012-12-30 NOTE — Progress Notes (Signed)
[facility] Tana Conch, MD Phone: [phone]  Subjective:  Chief complaint-noted  # Swollen lips Patient states she has a long history of allergic rhinitis and eczema and  allergies to multiple things including apples, shea and cocoa butter, cats, and several other fruits. If she kisses her kids after they eat apples at school she will have swelling and itching of her lips as well as an itchy throat. She has learned to ask them before kissing them and ha snot done this recently.   2 weeks ago, patient noted new onset swelling of her lips. She has not tried new makeup but admits that she may have bought a new tube/container and started using (possible ingredient change). 4-5 days ago she also woke up with crusting over her eyes and since that time she has severe itching and very mild pain. No particular area has been painful. No injury to eye or splash of liquids into the eye. Patient has not been taking zyrtec. She thought the swelling of her eyes was from eczema so she tried triamcinolone then the areas tended to dry out. The lips are less swollen than previous and the eye brows are also less severely inflamed. The itchiness has persisted.  Of note, patient has had intermittent lip swelling for years which she attributed to other allergies but has been on lisinopril for several years as well.   ROS- She denies itchy throat or feeling of fullness. She denies enlargement of tongue. No hives.   Past Medical History Patient Active Problem List   Diagnosis Date Noted  . at risk diabetes (a1c 5.7)  04/24/2011    Priority: Medium  . DEPRESSION, MAJOR 06/06/2009    Priority: Medium  . TOBACCO USER 11/07/2008    Priority: Medium  . HYPERTENSION, ESSENTIAL NOS 11/05/2006    Priority: Medium  . Bacterial vaginosis (on suppressive therapy) 06/17/2010    Priority: Low  . Premenstrual dysphoric disorder 06/17/2010    Priority: Low  . Recurrent yeast vaginitis (on  suppressive therapy)  01/12/2010    Priority: Low  . Obesity, unspecified 06/21/2008    Priority: Low  . RHINITIS, ALLERGIC NOS 11/05/2006    Priority: Low  . ECZEMA, ATOPIC DERMATITIS 05/01/2006    Priority: Low  . PAPANICOLAOU SMEAR, ABNORMAL 05/01/2006    Priority: Low   Medications- reviewed and updated Current Outpatient Prescriptions on File Prior to Visit  Medication Sig Dispense Refill  . cetirizine (ZYRTEC) 10 MG tablet Take 1 tablet (10 mg total) by mouth daily.  30 tablet  11  . fluconazole (DIFLUCAN) 150 MG tablet Take 150 mg by mouth as needed.      . fluticasone (FLONASE) 50 MCG/ACT nasal spray Place 2 sprays into the nose daily.  16 g  6  . ibuprofen (ADVIL,MOTRIN) 800 MG tablet TAKE 1 TABLET BY MOUTH 3 TIMES DAILY AS NEEDED . TAKE WITH FOOD  90 tablet  1  . lactobacillus acidophilus & bulgar (LACTINEX) chewable tablet TAKE 2 TABLETS 3 TIMES A DAY WITH FOOD  500 tablet  3  . valACYclovir (VALTREX) 1000 MG tablet Take 1 tablet (1,000 mg total) by mouth daily.  30 tablet  3   No current facility-administered medications on file prior to visit.    Objective: BP 140/90  Pulse 80  Temp(Src) 98.6 F (37 C) (Oral)  Ht 5\' 10"  (1.778 m)  Wt 262 lb (118.842 kg)  BMI 37.59 kg/m2  LMP 12/13/2012 Gen: NAD, resting comfortably HEENT: lips swollen  and erythematous. No tongue or throat swelling noted. No hoarseness. Eye lids with mild edema and mild scaling though not specifically at base of eye lash. Sclera with minimal erythema. No foreign objects noted.  CV: RRR no murmurs rubs or gallops Lungs: CTAB no crackles, wheeze, rhonchi Skin: no hives or rash noted on body.  Neuro: grossly normal, moves all extremities  Assessment/Plan:  Eye lid swelling-contact dermatitis vs. Allergic conjunctivitis vs. Viral conjunctivitis. Given intense itching and only watery discharge recently, lean towards allergic conjunctivitis so will start pataday along with zyrtec (could also benefit  from using her flonase). Think eyelid scaling likely from drying from triamcinolone-advised vaseline instead.

## 2013-01-07 ENCOUNTER — Ambulatory Visit (INDEPENDENT_AMBULATORY_CARE_PROVIDER_SITE_OTHER): Payer: Medicaid Other | Admitting: Family Medicine

## 2013-01-07 ENCOUNTER — Encounter: Payer: Self-pay | Admitting: Family Medicine

## 2013-01-07 VITALS — BP 130/88 | HR 90 | Temp 97.8°F | Ht 70.0 in | Wt 261.0 lb

## 2013-01-07 DIAGNOSIS — T783XXD Angioneurotic edema, subsequent encounter: Secondary | ICD-10-CM

## 2013-01-07 DIAGNOSIS — Z5189 Encounter for other specified aftercare: Secondary | ICD-10-CM

## 2013-01-07 DIAGNOSIS — I10 Essential (primary) hypertension: Secondary | ICD-10-CM

## 2013-01-07 NOTE — Patient Instructions (Addendum)
I am glad the swelling in your lips is better. Finish up the 2 weeks of zantac then continue zyrtec after that. You should avoid any medicine like lisinopril or enalapril in the future. You should also tell your doctor if anyone tries to start you on an ARB like losartan as this would be a possible risk from this.   Your blood pressure looks great on full dose hydrochlorothiazide. Check your blood pressure at home and if regularly the top number is above 140 or the bottom number is above 90 then please give me a call.   Check in for blood pressure in about 6 months, Dr. Durene Cal  P.S. 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 I would continue to encourage you to quit. 1800quit now is a resource for quitting smoking. They have free nicotine replacement sometimes. Let me know if I can help.   Health Maintenance Due  Topic Date Due  . Influenza Vaccine  10/02/2012

## 2013-01-08 NOTE — Progress Notes (Signed)
[facility] Tana Conch, MD Phone: [phone]  Subjective:  Chief complaint-noted  # Angioedema Patient reports for follow up after recent angioedema. Patient was taken off ace-i and placed on zyrtec and ranitidine. Her symptoms have resolved. Patient was also having some eye irritation which was thought to be allergic which has resolved on pataday.  ROS-no throat swelling, no shortness of breath  # Hypertension BP Readings from Last 3 Encounters:  01/07/13 130/88  12/30/12 140/90  08/18/12 127/86  Home BP monitoring-no Compliant with medications-es without side effects, now HCTZ alone Denies any chest pain or shortness of breath.   Past Medical History Patient Active Problem List   Diagnosis Date Noted  . 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 at risk diabetes (a1c 5.7)  04/24/2011    Priority: Medium  . DEPRESSION, MAJOR 06/06/2009    Priority: Medium  . TOBACCO USER 11/07/2008    Priority: Medium  . HYPERTENSION, ESSENTIAL NOS 11/05/2006    Priority: Medium  . Bacterial vaginosis (on suppressive therapy) 06/17/2010    Priority: Low  . Premenstrual dysphoric disorder 06/17/2010    Priority: Low  . Recurrent yeast vaginitis (on suppressive therapy)  01/12/2010    Priority: Low  . Obesity, unspecified 06/21/2008    Priority: Low  . RHINITIS, ALLERGIC NOS 11/05/2006    Priority: Low  . ECZEMA, ATOPIC DERMATITIS 05/01/2006    Priority: Low  . PAPANICOLAOU SMEAR, ABNORMAL 05/01/2006    Priority: Low    Medications- reviewed and updated Current Outpatient Prescriptions on File Prior to Visit  Medication Sig Dispense Refill  . cetirizine (ZYRTEC) 10 MG tablet Take 1 tablet (10 mg total) by mouth daily.  30 tablet  11  . fluconazole (DIFLUCAN) 150 MG tablet Take 150 mg by mouth as needed.      . fluticasone (FLONASE) 50 MCG/ACT nasal spray Place 2 sprays into the nose daily.  16 g  6  . hydrochlorothiazide (HYDRODIURIL) 25 MG  tablet Take 1 tablet (25 mg total) by mouth daily.  30 tablet  11  . ibuprofen (ADVIL,MOTRIN) 800 MG tablet TAKE 1 TABLET BY MOUTH 3 TIMES DAILY AS NEEDED . TAKE WITH FOOD  90 tablet  1  . lactobacillus acidophilus & bulgar (LACTINEX) chewable tablet TAKE 2 TABLETS 3 TIMES A DAY WITH FOOD  500 tablet  3  . metroNIDAZOLE (METROGEL) 0.75 % vaginal gel PLACE 1 APPLICATORFUL VAGINALLY 2 TIMES A WEEK  70 g  1  . Olopatadine HCl 0.2 % SOLN Apply 1 drop to eye 2 (two) times daily as needed.  2.5 mL  0  . ranitidine (ZANTAC) 150 MG tablet Take 1 tablet (150 mg total) by mouth 2 (two) times daily.  60 tablet  0  . triamcinolone ointment (KENALOG) 0.1 % Apply topically 2 (two) times daily. Use 1 to 2 times a day on arms. 2 weeks max  30 g  1  . valACYclovir (VALTREX) 1000 MG tablet Take 1 tablet (1,000 mg total) by mouth daily.  30 tablet  3   No current facility-administered medications on file prior to visit.    Objective: BP 130/88  Pulse 90  Temp(Src) 97.8 F (36.6 C) (Oral)  Ht 5\' 10"  (1.778 m)  Wt 261 lb (118.389 kg)  BMI 37.45 kg/m2  LMP 01/06/2013 Gen: NAD, resting comfortably HEENT: lips normal in appearance. No tongue or throat swelling noted. No hoarseness. Eye lids scaling has improved, she has a very small  2 mm cut below one of her eyes which has irritated her. PERRLA. Sclera without erythema.   CV: RRR no murmurs rubs or gallops Lungs: CTAB no crackles, wheeze, rhonchi Skin: no hives or rash noted on body.  Neuro: grossly normal, moves all extremities  Assessment/Plan:

## 2013-01-08 NOTE — Assessment & Plan Note (Addendum)
Resolved on zyrtec, ranitidine and off ace-i. Ace-i added to allergies. It is still possible there is another trigger and if recurs off ace-i would consider sending to allergist.

## 2013-01-08 NOTE — Assessment & Plan Note (Signed)
Well controlled off of ace-i. COntinue hctz 25mg . F/u every 6 months.

## 2013-02-23 ENCOUNTER — Ambulatory Visit (INDEPENDENT_AMBULATORY_CARE_PROVIDER_SITE_OTHER): Payer: Medicaid Other | Admitting: Family Medicine

## 2013-02-23 ENCOUNTER — Encounter: Payer: Self-pay | Admitting: Family Medicine

## 2013-02-23 ENCOUNTER — Other Ambulatory Visit (HOSPITAL_COMMUNITY)
Admission: RE | Admit: 2013-02-23 | Discharge: 2013-02-23 | Disposition: A | Discharge status: Home / Self Care | Payer: Medicaid Other | Source: Ambulatory Visit | Attending: Family Medicine | Admitting: Family Medicine

## 2013-02-23 VITALS — BP 138/87 | HR 85 | Temp 98.9°F | Ht 70.0 in | Wt 270.0 lb

## 2013-02-23 DIAGNOSIS — N898 Other specified noninflammatory disorders of vagina: Secondary | ICD-10-CM

## 2013-02-23 DIAGNOSIS — Z113 Encounter for screening for infections with a predominantly sexual mode of transmission: Secondary | ICD-10-CM | POA: Insufficient documentation

## 2013-02-23 DIAGNOSIS — N760 Acute vaginitis: Secondary | ICD-10-CM

## 2013-02-23 LAB — POCT WET PREP (WET MOUNT)

## 2013-02-23 NOTE — Patient Instructions (Signed)
Shenica, it was nice seeing you today.  Please take another Diflucan pill in three days, wait three more days and take another one.  Please call Dr. Durene Cal in one week to see how you are doing and you may benefit from long term suppression therapy.   Thanks, Dr. Paulina Fusi

## 2013-02-23 NOTE — Assessment & Plan Note (Signed)
Pt with recurrent yeast infection, acute exacerbation today despite taking Diflucan x 2 the past two days.  Has been on suppressive therapy on the past without great results on diflucan 150 mg q week x 6 months.  I think at this point she may benefit from clotrimazole vaginal 500 mg vaginal suppository into the vagina q week x 6 months.  Will have her do diflucan 150 mg x 1 every three days x 3 doses for now and call back to see how she is doing in one week.  As well, will test for STD's including HIV, syphilis, GC/Chlamydia.

## 2013-02-23 NOTE — Progress Notes (Signed)
Victoria Powers is a 32 y.o. female who presents today for vaginal d/c.  Pt has history of vaginal d/c with recurrent yeast infections with acute exacerbation two days ago in which she took diflucan each of the last two days without much effect.  She denies any itching or malodorous d/c, however, she would like to be tested for STD's as well.  She has taken diflucan 150 mg q week x 6 months previously with minimal effect for her yeast vaginitis, and takes a pill about every other week now.    Past Medical History  Diagnosis Date  . Hypertension   . Allergy   . 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 Condyloma acuminatum 05/01/2006    Qualifier: Diagnosis of  By: Haydee Salter      History  Smoking status  . Current Every Day Smoker -- 0.50 packs/day for 4 years  Smokeless tobacco  . Never Used    Family History  Problem Relation Age of Onset  . Diabetes Mother   . Hypertension Mother   . Diabetes Father   . Hyperlipidemia Father   . Hypertension Father   . Cancer Maternal Aunt     Pancreatic     Current Outpatient Prescriptions on File Prior to Visit  Medication Sig Dispense Refill  . cetirizine (ZYRTEC) 10 MG tablet Take 1 tablet (10 mg total) by mouth daily.  30 tablet  11  . fluconazole (DIFLUCAN) 150 MG tablet Take 150 mg by mouth as needed.      . fluticasone (FLONASE) 50 MCG/ACT nasal spray Place 2 sprays into the nose daily.  16 g  6  . hydrochlorothiazide (HYDRODIURIL) 25 MG tablet Take 1 tablet (25 mg total) by mouth daily.  30 tablet  11  . ibuprofen (ADVIL,MOTRIN) 800 MG tablet TAKE 1 TABLET BY MOUTH 3 TIMES DAILY AS NEEDED . TAKE WITH FOOD  90 tablet  1  . lactobacillus acidophilus & bulgar (LACTINEX) chewable tablet TAKE 2 TABLETS 3 TIMES A DAY WITH FOOD  500 tablet  3  . metroNIDAZOLE (METROGEL) 0.75 % vaginal gel PLACE 1 APPLICATORFUL VAGINALLY 2 TIMES A WEEK  70 g  1  . Olopatadine HCl 0.2  % SOLN Apply 1 drop to eye 2 (two) times daily as needed.  2.5 mL  0  . ranitidine (ZANTAC) 150 MG tablet Take 1 tablet (150 mg total) by mouth 2 (two) times daily.  60 tablet  0  . triamcinolone ointment (KENALOG) 0.1 % Apply topically 2 (two) times daily. Use 1 to 2 times a day on arms. 2 weeks max  30 g  1  . valACYclovir (VALTREX) 1000 MG tablet Take 1 tablet (1,000 mg total) by mouth daily.  30 tablet  3   No current facility-administered medications on file prior to visit.    ROS: Per HPI.  All other systems reviewed and are negative.   Physical Exam Filed Vitals:   02/23/13 0849  BP: 138/87  Pulse: 85  Temp: 98.9 F (37.2 C)    Physical Examination: General appearance - alert, well appearing, and in no distress GU: normal external vagina, normal appearing cervical os, copious curd like whitish dc.

## 2013-02-24 LAB — RPR

## 2013-02-26 ENCOUNTER — Other Ambulatory Visit: Payer: Self-pay | Admitting: Family Medicine

## 2013-03-01 ENCOUNTER — Encounter: Payer: Self-pay | Admitting: Family Medicine

## 2013-03-11 ENCOUNTER — Telehealth: Payer: Self-pay | Admitting: Family Medicine

## 2013-03-11 NOTE — Telephone Encounter (Signed)
Patient calls, recently taken off of Lisinopril and put on HCTZ due to edema. Patient states she still having a lot of edema in hands and feet. She is unable to take off from work to be seen again due to just starting a new job but would like to speak to Dr. Yong Channel about this matter. Please call patient.

## 2013-03-12 NOTE — Telephone Encounter (Signed)
Swelling of hands and feet for about a month. Started on hctz 2 months ago. Advised patient needs an appointment for further evaluation. Of note, no shortness of breath, calf pain, unilateral swelling, recent travel.

## 2013-04-22 ENCOUNTER — Other Ambulatory Visit: Payer: Self-pay | Admitting: Emergency Medicine

## 2013-04-22 DIAGNOSIS — N898 Other specified noninflammatory disorders of vagina: Secondary | ICD-10-CM

## 2013-04-22 DIAGNOSIS — A6000 Herpesviral infection of urogenital system, unspecified: Secondary | ICD-10-CM

## 2013-04-22 DIAGNOSIS — Z8619 Personal history of other infectious and parasitic diseases: Secondary | ICD-10-CM

## 2013-04-23 DIAGNOSIS — A6000 Herpesviral infection of urogenital system, unspecified: Secondary | ICD-10-CM | POA: Insufficient documentation

## 2013-04-23 MED ORDER — VALACYCLOVIR HCL 1 G PO TABS: 1000.0000 mg | ORAL_TABLET | Freq: Two times a day (BID) | ORAL | Status: DC

## 2013-04-23 NOTE — Telephone Encounter (Signed)
Appt made for 05-03-13.  Jazmin Hartsell,CMA

## 2013-04-23 NOTE — Telephone Encounter (Signed)
LM for pt to call back.  Please give message from Dr. Yong Channel when she calls back.  Thanks  [employer]

## 2013-04-23 NOTE — Telephone Encounter (Signed)
Received refill request for valtrex. This was not prescribed orginally by our office but by ob/gyn. Dr. Bridgett Larsson was working at that office and sent in Rx but not sent by [place]. Patient would need to call that office as it was a part of her overall care plan. She may need to schedule a visit with ob/gyn.   We do not have HSV on her problem list.

## 2013-04-23 NOTE — Telephone Encounter (Signed)
Spoke with pt and she was only seen at [facility] for the problem visit.  She is not a regular patient there.  Also this medication was given to her for recurrent BV.  It has been working very well and she is asymptomatic right now and there would be no sample to get today.  She still has a few days left of this medication but doesn't want to run completely out and have it come back.  Please advise.  Pt states that it is very hard for her to get off work and make it in for an appt today.  Jazmin Hartsell,CMA

## 2013-04-23 NOTE — Telephone Encounter (Signed)
She has been to ob/gyn x 2 on 04/03/12 and 07/23/12. 1/31 was for recurrent BV infections and vaginal discharge. She was started on suppressive therapy with valtrex 1 g daily by ob/gyn and not our office. This was thought to be potential cause of recurrent BV.  I do not see any records of HSV by our office.   Additionally, patient was given 3 refills and 30 tablets to use for suppressive therapy but it has been over a year. It is not clear how she is using or for what indication. She needs to come for an office visit though it does not have to be this afternoon if she wants [place] to care for this issue. We have never assessed her in our office for this so I do not think it is reasonable to prescribe this medication long term without a visit. i will give a 1 time prescription if she is to have an outbreak but no further refills until she is assessed at our office or ob/gyn.

## 2013-05-03 ENCOUNTER — Ambulatory Visit: Payer: Medicaid Other | Admitting: Family Medicine

## 2013-05-21 ENCOUNTER — Other Ambulatory Visit: Payer: Self-pay | Admitting: Family Medicine

## 2013-05-31 ENCOUNTER — Other Ambulatory Visit: Payer: Self-pay | Admitting: Family Medicine

## 2013-06-01 ENCOUNTER — Other Ambulatory Visit: Payer: Self-pay | Admitting: Family Medicine

## 2013-06-01 NOTE — Telephone Encounter (Signed)
Spoke with pt regarding refill request for Valtrex.  Pt informed that she will need an office visit before another refill can be authorized.  Pt stated she is not taking Valtrex for herpes but for recurrent bacterial vaginosis.   Pt stated she was given first Rx from [facility].  Pt did state understanding that an appt will be needed for next refill.  Victoria Barrow, RN

## 2013-06-23 ENCOUNTER — Ambulatory Visit (INDEPENDENT_AMBULATORY_CARE_PROVIDER_SITE_OTHER): Payer: BC Managed Care – PPO | Admitting: Family Medicine

## 2013-06-23 ENCOUNTER — Encounter: Payer: Self-pay | Admitting: Family Medicine

## 2013-06-23 ENCOUNTER — Other Ambulatory Visit (HOSPITAL_COMMUNITY)
Admission: RE | Admit: 2013-06-23 | Discharge: 2013-06-23 | Disposition: A | Discharge status: Home / Self Care | Payer: BC Managed Care – PPO | Source: Ambulatory Visit | Attending: Family Medicine | Admitting: Family Medicine

## 2013-06-23 VITALS — BP 128/85 | HR 91 | Temp 98.5°F | Ht 70.0 in | Wt 275.0 lb

## 2013-06-23 DIAGNOSIS — A499 Bacterial infection, unspecified: Secondary | ICD-10-CM

## 2013-06-23 DIAGNOSIS — N760 Acute vaginitis: Secondary | ICD-10-CM

## 2013-06-23 DIAGNOSIS — B9689 Other specified bacterial agents as the cause of diseases classified elsewhere: Secondary | ICD-10-CM

## 2013-06-23 DIAGNOSIS — Z20828 Contact with and (suspected) exposure to other viral communicable diseases: Secondary | ICD-10-CM

## 2013-06-23 DIAGNOSIS — R238 Other skin changes: Secondary | ICD-10-CM

## 2013-06-23 DIAGNOSIS — Z202 Contact with and (suspected) exposure to infections with a predominantly sexual mode of transmission: Secondary | ICD-10-CM

## 2013-06-23 DIAGNOSIS — A6000 Herpesviral infection of urogenital system, unspecified: Secondary | ICD-10-CM

## 2013-06-23 DIAGNOSIS — Z113 Encounter for screening for infections with a predominantly sexual mode of transmission: Secondary | ICD-10-CM | POA: Insufficient documentation

## 2013-06-23 DIAGNOSIS — L988 Other specified disorders of the skin and subcutaneous tissue: Secondary | ICD-10-CM

## 2013-06-23 DIAGNOSIS — N898 Other specified noninflammatory disorders of vagina: Secondary | ICD-10-CM

## 2013-06-23 LAB — POCT WET PREP (WET MOUNT): Clue Cells Wet Prep Whiff POC: NEGATIVE

## 2013-06-23 MED ORDER — VALACYCLOVIR HCL 1 G PO TABS: ORAL_TABLET | ORAL | Status: DC

## 2013-06-23 NOTE — Patient Instructions (Addendum)
Please call for a lab visit.  I gave you 1 refill on valtrex to take daily for now. I am concerned your lesions may be due to herpes but there was not a good one for [place] to unroof to test so we will test through your blood.  Orders Placed This Encounter  Procedures  . HIV antibody    Standing Status: Future     Number of Occurrences:      Standing Expiration Date: 06/24/2014  . RPR    Standing Status: Future     Number of Occurrences:      Standing Expiration Date: 06/24/2014  . HSV(herpes simplex vrs) 1+2 ab-IgG    Standing Status: Future     Number of Occurrences:      Standing Expiration Date: 06/24/2014  . POCT Wet Prep [facility])

## 2013-06-24 NOTE — Assessment & Plan Note (Addendum)
Patient to return for HSV1 and HSV2 antibody testing as well as RPR and HIV testing. Gave 1 month of daily valtrex. May need to send back to gyn for treatment of recurrent BV if no improvement on daily valtrex.

## 2013-06-24 NOTE — Progress Notes (Signed)
Garret Reddish, MD Phone: [phone]  Subjective:   Victoria Powers is a 33 y.o. year old very pleasant female patient who presents with the following:  Vaginal Discharge/recurrent BV and candidiasis Patient with long term history of this and was [facility] last year in January. She was placed on valtrex for possible HSV as cause of recurrence. She has continued to use metrogel or diflucan at least once a week for the last year for various amounts of discharge. Today, patient comes in telling me that she has bumps in her groin for 3-4 months that when she restarted valtrex Improved. Thinks she may have had herpes when when she was 33 years old but not sure. At first she thought the bumps were due to her clipping her hair but these do not improve with clipping hair.   She has had sex typically with condoms but with new partner in last 6 months.   ROS- Denies nausea/vomiting/fever/chills/fatigue/overall sick feelings. No night sweats or unintentional weight loss. No urinary symptoms. Occasional itching.    Past Medical History- Patient Active Problem List   Diagnosis Date Noted  . Angioedema of lips 12/30/2012    Priority: Medium  . at risk diabetes (a1c 5.7)  04/24/2011    Priority: Medium  . DEPRESSION, MAJOR 06/06/2009    Priority: Medium  . TOBACCO USER 11/07/2008    Priority: Medium  . HYPERTENSION, ESSENTIAL NOS 11/05/2006    Priority: Medium  . Bacterial vaginosis (on suppressive therapy) 06/17/2010    Priority: Low  . Premenstrual dysphoric disorder 06/17/2010    Priority: Low  . Recurrent yeast vaginitis (on suppressive therapy)  01/12/2010    Priority: Low  . Obesity, unspecified 06/21/2008    Priority: Low  . RHINITIS, ALLERGIC NOS 11/05/2006    Priority: Low  . ECZEMA, ATOPIC DERMATITIS 05/01/2006    Priority: Low  . PAPANICOLAOU SMEAR, ABNORMAL 05/01/2006    Priority: Low  . Genital herpes 04/23/2013   Medications- reviewed and  updated Current Outpatient Prescriptions  Medication Sig Dispense Refill  . cetirizine (ZYRTEC) 10 MG tablet Take 1 tablet (10 mg total) by mouth daily.  30 tablet  11  . fluconazole (DIFLUCAN) 150 MG tablet Take 150 mg by mouth as needed.      . fluticasone (FLONASE) 50 MCG/ACT nasal spray Place 2 sprays into the nose daily.  16 g  6  . hydrochlorothiazide (HYDRODIURIL) 25 MG tablet Take 1 tablet (25 mg total) by mouth daily.  30 tablet  11  . ibuprofen (ADVIL,MOTRIN) 800 MG tablet TAKE 1 TABLET BY MOUTH 3 TIMES DAILY AS NEEDED . TAKE WITH FOOD  90 tablet  1  . lactobacillus acidophilus & bulgar (LACTINEX) chewable tablet TAKE 2 TABLETS 3 TIMES A DAY WITH FOOD  500 tablet  3  . METROGEL VAGINAL 0.75 % vaginal gel PLACE 1 APPLICATORFUL VAGINALLY 2 TIMES A WEEK  70 g  1  . Olopatadine HCl 0.2 % SOLN Apply 1 drop to eye 2 (two) times daily as needed.  2.5 mL  0  . ranitidine (ZANTAC) 150 MG tablet Take 1 tablet (150 mg total) by mouth 2 (two) times daily.  60 tablet  0  . triamcinolone ointment (KENALOG) 0.1 % Apply topically 2 (two) times daily. Use 1 to 2 times a day on arms. 2 weeks max  30 g  1  . valACYclovir (VALTREX) 1000 MG tablet Take once daily  30 tablet  0   No current facility-administered medications  for this visit.    Objective: BP 128/85  Pulse 91  Temp(Src) 98.5 F (36.9 C) (Oral)  Ht 5\' 10"  (1.778 m)  Wt 275 lb (124.739 kg)  BMI 39.46 kg/m2 Gen: NAD, resting comfortably on table Pelvic: cervix normal in appearance, no adnexal masses or tenderness, no cervical motion tenderness, uterus normal size, shape, and consistency and vagina with cottage cheese like discharge around cervix. Also on external genitalia-small papules (patient states improving)  Assessment/Plan:  Bacterial vaginosis (on suppressive therapy) States issue for years. PRN metrogel. ? HSV as cause of recurrence. May need chronic suppressive therapy of HSV.   Recurrent yeast vaginitis (on suppressive  therapy)  Wet prep/gc/chlamydia today for recurrent discharge. Likely will continue prn metrogel or diflucan.   Genital herpes Patient to return for HSV1 and HSV2 antibody testing as well as RPR and HIV testing. Gave 1 month of daily valtrex. May need to send back to gyn for treatment of recurrent BV if no improvement on daily valtrex.     Orders Placed This Encounter  Procedures  . HIV antibody    Standing Status: Future     Number of Occurrences:      Standing Expiration Date: 06/24/2014  . RPR    Standing Status: Future     Number of Occurrences:      Standing Expiration Date: 06/24/2014  . HSV(herpes simplex vrs) 1+2 ab-IgG    Standing Status: Future     Number of Occurrences:      Standing Expiration Date: 06/24/2014  . POCT Wet Prep [facility])    Meds ordered this encounter  Medications  . valACYclovir (VALTREX) 1000 MG tablet    Sig: Take once daily    Dispense:  30 tablet    Refill:  0

## 2013-06-24 NOTE — Assessment & Plan Note (Signed)
States issue for years. PRN metrogel. ? HSV as cause of recurrence. May need chronic suppressive therapy of HSV.

## 2013-06-24 NOTE — Assessment & Plan Note (Signed)
Wet prep/gc/chlamydia today for recurrent discharge. Likely will continue prn metrogel or diflucan.

## 2013-06-25 LAB — CERVICOVAGINAL ANCILLARY ONLY
Chlamydia: NEGATIVE
Neisseria Gonorrhea: NEGATIVE

## 2013-07-06 ENCOUNTER — Ambulatory Visit (INDEPENDENT_AMBULATORY_CARE_PROVIDER_SITE_OTHER): Payer: BC Managed Care – PPO | Admitting: Family Medicine

## 2013-07-06 ENCOUNTER — Encounter: Payer: Self-pay | Admitting: Family Medicine

## 2013-07-06 ENCOUNTER — Other Ambulatory Visit: Payer: BC Managed Care – PPO

## 2013-07-06 VITALS — BP 137/94 | HR 87 | Temp 98.3°F | Wt 276.0 lb

## 2013-07-06 DIAGNOSIS — Z202 Contact with and (suspected) exposure to infections with a predominantly sexual mode of transmission: Secondary | ICD-10-CM

## 2013-07-06 DIAGNOSIS — Z20828 Contact with and (suspected) exposure to other viral communicable diseases: Secondary | ICD-10-CM

## 2013-07-06 DIAGNOSIS — L989 Disorder of the skin and subcutaneous tissue, unspecified: Secondary | ICD-10-CM | POA: Insufficient documentation

## 2013-07-06 DIAGNOSIS — R238 Other skin changes: Secondary | ICD-10-CM

## 2013-07-06 LAB — HIV ANTIBODY (ROUTINE TESTING W REFLEX): HIV: NONREACTIVE

## 2013-07-06 NOTE — Progress Notes (Signed)
Patient ID: Victoria Powers, female   DOB: [birth year], 33 y.o.   MRN: [id number]  Tommi Rumps, MD Phone: [phone]  Victoria Powers is a 32 y.o. female who presents today for skin lesion.  Patient notes bump on right thigh x2 days. Noted it in the middle of the night when it started throbbing. Notes worrying about this due to previous similar lesion on the left thigh that she popped and turned into a staph infection. She was treated for antibiotics for that occurrence. She has not popped this current lesion. Notes mild redness, though has not been spreading. No fevers. Mild pain. Has not taken anything for this. Would not want this lanced at this time. Notes extensive history of yeast infections while on antibiotics, so would like to minimize use of antibiotics unless needed.   Patient is a smoker.   ROS: Per HPI   Physical Exam Filed Vitals:   07/06/13 0923  BP: 137/94  Pulse: 87  Temp: 98.3 F (36.8 C)     Gen: Well NAD Skin: right anterior thigh with nickel sized area of erythema mildly raised bump, no central pustule, no fluctuance, no surrounding induration, mild tenderness to palpation  Assessment/Plan: Please see individual problem list.

## 2013-07-06 NOTE — Progress Notes (Signed)
HIV,RPR AND HSV 1&2 DONE TODAY Victoria Powers

## 2013-07-06 NOTE — Patient Instructions (Signed)
Folliculitis  Folliculitis is redness, soreness, and swelling (inflammation) of the hair follicles. This condition can occur anywhere on the body. People with weakened immune systems, diabetes, or obesity have a greater risk of getting folliculitis. CAUSES  Bacterial infection. This is the most common cause.  Fungal infection.  Viral infection.  Contact with certain chemicals, especially oils and tars. Long-term folliculitis can result from bacteria that live in the nostrils. The bacteria may trigger multiple outbreaks of folliculitis over time. SYMPTOMS Folliculitis most commonly occurs on the scalp, thighs, legs, back, buttocks, and areas where hair is shaved frequently. An early sign of folliculitis is a small, white or yellow, pus-filled, itchy lesion (pustule). These lesions appear on a red, inflamed follicle. They are usually less than 0.2 inches (5 mm) wide. When there is an infection of the follicle that goes deeper, it becomes a boil or furuncle. A group of closely packed boils creates a larger lesion (carbuncle). Carbuncles tend to occur in hairy, sweaty areas of the body. DIAGNOSIS  Your caregiver can usually tell what is wrong by doing a physical exam. A sample may be taken from one of the lesions and tested in a lab. This can help determine what is causing your folliculitis. TREATMENT  Treatment may include:  Applying warm compresses to the affected areas.  Taking antibiotic medicines orally or applying them to the skin.  Draining the lesions if they contain a large amount of pus or fluid.  Laser hair removal for cases of long-lasting folliculitis. This helps to prevent regrowth of the hair. HOME CARE INSTRUCTIONS  Apply warm compresses to the affected areas as directed by your caregiver.  If antibiotics are prescribed, take them as directed. Finish them even if you start to feel better.  You may take over-the-counter medicines to relieve itching.  Do not shave  irritated skin.  Follow up with your caregiver as directed. SEEK IMMEDIATE MEDICAL CARE IF:   You have increasing redness, swelling, or pain in the affected area.  You have a fever. MAKE SURE YOU:  Understand these instructions.  Will watch your condition.  Will get help right away if you are not doing well or get worse. Document Released: 04/29/2001 Document Revised: 08/20/2011 Document Reviewed: 05/21/2011 Exit[employer] Patient Information 2014 [employer].  

## 2013-07-06 NOTE — Assessment & Plan Note (Signed)
Area of erythema appears to be a potential folliculitis. No signs of spreading infection or cellulitis. No signs of abscess. No current need for antibiotics. Advised patient to monitor for spreading erythema, fevers, worsening pain. Can use tylenol for discomfort and to use warm compresses. If worsens she is to contact us. She was advised not to pop this area. Given return precautions.

## 2013-07-07 ENCOUNTER — Encounter: Payer: Self-pay | Admitting: Family Medicine

## 2013-07-07 LAB — HSV(HERPES SIMPLEX VRS) I + II AB-IGG
HSV 1 Glycoprotein G Ab, IgG: 8.91 IV — ABNORMAL HIGH
HSV 2 Glycoprotein G Ab, IgG: 4.84 IV — ABNORMAL HIGH

## 2013-07-07 LAB — RPR

## 2013-07-24 ENCOUNTER — Other Ambulatory Visit: Payer: Self-pay | Admitting: Family Medicine

## 2013-08-23 ENCOUNTER — Encounter: Payer: BC Managed Care – PPO | Admitting: Family Medicine

## 2013-08-23 NOTE — Progress Notes (Signed)
Patient 15 minutes late to appointment. Patient that was on time was seen before patient. Patient did not want to wait to be seen so left.  This encounter was created in error - please disregard. See my other note about circumstances.

## 2013-10-03 ENCOUNTER — Other Ambulatory Visit: Payer: Self-pay | Admitting: Family Medicine

## 2013-10-04 NOTE — Telephone Encounter (Signed)
Is this ok to refill?  

## 2013-12-27 ENCOUNTER — Other Ambulatory Visit: Payer: Self-pay | Admitting: *Deleted

## 2013-12-27 DIAGNOSIS — I10 Essential (primary) hypertension: Secondary | ICD-10-CM

## 2013-12-28 ENCOUNTER — Other Ambulatory Visit: Payer: Self-pay | Admitting: *Deleted

## 2013-12-28 DIAGNOSIS — I10 Essential (primary) hypertension: Secondary | ICD-10-CM

## 2013-12-29 MED ORDER — HYDROCHLOROTHIAZIDE 25 MG PO TABS: 25.0000 mg | ORAL_TABLET | Freq: Every day | ORAL | Status: DC

## 2014-01-07 ENCOUNTER — Other Ambulatory Visit: Payer: Self-pay | Admitting: *Deleted

## 2014-01-10 MED ORDER — METRONIDAZOLE 0.75 % VA GEL: VAGINAL | Status: DC

## 2014-03-02 ENCOUNTER — Other Ambulatory Visit: Payer: Self-pay | Admitting: Family Medicine

## 2014-03-18 ENCOUNTER — Other Ambulatory Visit: Payer: Self-pay | Admitting: Family Medicine

## 2014-03-18 DIAGNOSIS — B9689 Other specified bacterial agents as the cause of diseases classified elsewhere: Secondary | ICD-10-CM

## 2014-03-18 DIAGNOSIS — N760 Acute vaginitis: Principal | ICD-10-CM

## 2014-03-28 ENCOUNTER — Other Ambulatory Visit: Payer: Self-pay | Admitting: *Deleted

## 2014-03-28 DIAGNOSIS — A6000 Herpesviral infection of urogenital system, unspecified: Secondary | ICD-10-CM

## 2014-03-28 MED ORDER — VALACYCLOVIR HCL 1 G PO TABS: ORAL_TABLET | ORAL | Status: DC

## 2014-03-29 ENCOUNTER — Encounter: Payer: Self-pay | Admitting: Family Medicine

## 2014-03-29 ENCOUNTER — Ambulatory Visit (INDEPENDENT_AMBULATORY_CARE_PROVIDER_SITE_OTHER): Payer: BLUE CROSS/BLUE SHIELD | Admitting: Family Medicine

## 2014-03-29 VITALS — BP 149/102 | HR 76 | Temp 99.1°F | Wt 278.0 lb

## 2014-03-29 DIAGNOSIS — Z13220 Encounter for screening for lipoid disorders: Secondary | ICD-10-CM | POA: Diagnosis not present

## 2014-03-29 DIAGNOSIS — N926 Irregular menstruation, unspecified: Secondary | ICD-10-CM

## 2014-03-29 DIAGNOSIS — N920 Excessive and frequent menstruation with regular cycle: Secondary | ICD-10-CM | POA: Diagnosis not present

## 2014-03-29 DIAGNOSIS — I10 Essential (primary) hypertension: Secondary | ICD-10-CM | POA: Diagnosis not present

## 2014-03-29 LAB — POCT HEMOGLOBIN: Hemoglobin: 11.5 g/dL — AB (ref 12.2–16.2)

## 2014-03-29 LAB — POCT URINE PREGNANCY: Preg Test, Ur: NEGATIVE

## 2014-03-29 MED ORDER — MEDROXYPROGESTERONE ACETATE 10 MG PO TABS: 10.0000 mg | ORAL_TABLET | Freq: Every day | ORAL | Status: AC

## 2014-03-29 MED ORDER — AMLODIPINE BESYLATE 5 MG PO TABS
5.0000 mg | ORAL_TABLET | Freq: Every day | ORAL | Status: DC
Start: 2014-03-29 — End: 2014-03-29

## 2014-03-29 MED ORDER — AMLODIPINE BESYLATE 5 MG PO TABS: 5.0000 mg | ORAL_TABLET | Freq: Every day | ORAL | Status: DC

## 2014-03-29 MED ORDER — ESTROGENS CONJUGATED 0.625 MG PO TABS: 0.6250 mg | ORAL_TABLET | Freq: Every day | ORAL | Status: DC

## 2014-03-29 NOTE — Assessment & Plan Note (Addendum)
Menorrhagia with very heavy bleeding 6 days. POC Hgb 10.5. Still with severe symptoms of PMDD. Not good candidate for OCP or IUD given comorbidities (frequent vaginal infections), age and smoking per eval at [facility]. TVUS done 2010 with minimal ovulatory fluid, uterine retroflexion. Also reports bothersome facial hair and is obese. Consider PCOS, fibroid, endometriosis. - Upreg>>neg. TSH future lab (lab closed today). - Pelvic US to eval for fibroids, endometriosis, ovarian cysts; Urged pt to f/u with OB/Gyn where she had been evaluated in 03/2012.  - If no improvement, would perform endometrial biopsy if not done recently. - Provera 5-10 day course; f/u if bleeding not improving - Consider repeat Gc/chlamydia given sexually active 2 partners last 6 mo with only intermittent condom use - did not perform today as pain is chronic, abdomen is only minimally tender, pt not febrile. Return precautions and consider at f/u. - Urged pt to try prozac 20mg  daily 2 weeks prior to periods as prescribed by OB/Gyn at last visit. - Precepted with Dr McDiarmid who agrees with plan.

## 2014-03-29 NOTE — Progress Notes (Signed)
Patient ID: Victoria Powers, female   DOB: 03/24/1980, 34 y.o.   MRN: [id number] Subjective:   CC: F/u BP, periods getting worse   HPI:   F/u HTN Patient was previously on lisinopril-HCTZ but had allergy to it so lisinopril was stopped (presumed angioedema arms/legs). She does not check BPs at home. She has not had headaches but hands and feet still stay swollen. Denies chest pain, dyspnea, dizziness, or fainting, or blurred vision.    Periods worsening Here for body pain, feeling of extreme fatigue, and pelvic pain and heaviness during periods that is a chronic issue, along with persistent bleeding for 6 weeks.  She reports that she has had symptoms for a long time now, and has been evaluated here and at [facility], with symptoms thought most likely due to premenstrual dysphoric disorder. She was thought a poor candidate for OCP (age an smoking hx), IUD (prone to yeast and BV), and she declined prozac. She has been suffering through it and intermittently taking ibuprofen 800mg . Symptoms so severe that it "stops her life". She would be somewhat amenable to hysterectomy. Bleeding is usually heavy but regular, every 30 days lasting 6 days. However, for the past 3.5 weeks, she has had bright red light bleeding (changing panty liner a few times a day) and now with 6 days extremely heavy bleeding that is not lightening up (requiring 1/2 box pads daily), worsened pain and bloating, always feeling cold. Reports occasional constipation. Denies fevers, dysuria, abnormal worsened vaginal discharge over baseline.    Review of Systems - Per HPI.   PMH - risk for DM, MDD, eczema, genital herpes, HTN, obesity, premenstrual dysphoric disorder, tobacco use.  H/o BTL SH: Sexually active with 2 partners in last 6 mnths, uses condoms intermittently. Smoking status: current every day smoker.    Objective:  Physical Exam BP 149/102 mmHg  Pulse 76  Temp(Src) 99.1 F (37.3 C) (Oral)  Wt 278 lb  (126.1 kg)  LMP 02/26/2014 (Approximate) GEN: NAD CV: RRR, no m/r/g, 2+ B radial pulses PULM: CTAB, normal  ABD: S/mild tenderness pelvic/ND EXTR: No LE edema or calf tenderness    Assessment:     Victoria Powers is a 34 y.o. female here for f/u BP and PMDD with heavy bleeding.    Plan:     # See problem list and after visit summary for problem-specific plans.  Follow-up: Follow up in 2 weeks for BP f/u with Dr Berkley Harvey.   Hilton Sinclair, MD [place]

## 2014-03-29 NOTE — Assessment & Plan Note (Signed)
BP likely chronically elevated due to suboptimal control. Most recent renal function normal (04/2011). Hand/feet swelling may be due to poor BP control. Today, 149/102. - Lab closed so will order future lab and pt to come in for lab-only appt for BMET, lipid panel, TSH. - Start amlodipine 5mg  daily. - Check BP 2-3 times at home and bring to f/u. - Quit smoking - F/u in 2 weeks with Dr Berkley Harvey.

## 2014-03-29 NOTE — Patient Instructions (Addendum)
Great to see you today.  We are ordering a pelvic ultrasound.  You should get a call about scheduling this. Call us in 1 week if not. Take provera for 10 days and come back if bleeding not improving. I urge you to call and schedule [facility] and consider starting the prozac they had recommended.  For your blood pressure, start taking norvasc (amlodipine) daily. Come back for lab only appointment for kidney function, thyroid study, and cholesterol. Follow up with Dr Berkley Harvey in 2 weeks to see how BP doing. In the meantime, check 3-4 BPs at home and write them down to bring to the appointment.  Best,  Hilton Sinclair, MD

## 2014-04-01 ENCOUNTER — Telehealth: Payer: Self-pay | Admitting: *Deleted

## 2014-04-01 NOTE — Telephone Encounter (Signed)
Pt called stating her blood pressure was 156/103 for the past two days despite taking her blood pressure medications as prescribed.  Pt stated she did have a headache, but it has subsided.  Pt denies any SOB, chest pain or dizziness.  Will forward to PCP.  Derl Barrow, RN

## 2014-04-04 NOTE — Telephone Encounter (Signed)
Called to schedule a follow up appt with PCP.  Pt stated she does not have a headache today, felt dizzy all weekend.  BP today 146-90s went down to 136-92 about 10:30 AM.  Pt stated she will need a morning appt, PCP only has afternoon appt.  Please advise.  Derl Barrow, RN

## 2014-04-04 NOTE — Telephone Encounter (Signed)
Ok to schedule with another provider for HTN visit. Please also schedule with me in 2-4 weeks afterwards for BP check / medication titration.

## 2014-04-04 NOTE — Telephone Encounter (Signed)
BP not extremely elevated. Could be related to pain / HA but she was supposed to follow-up with PCP (me) for HTN anyway. Please call and have her scheduled PCP apt.   Thanks,  [employer]

## 2014-04-05 NOTE — Telephone Encounter (Signed)
Left voice message for pt to call and schedule an appt with someone on [facility] team for blood pressure and medication follow per Dr. Berkley Harvey.  Pt to follow up with PCP 2-4 weeks after.  Derl Barrow, RN

## 2014-04-15 ENCOUNTER — Ambulatory Visit (HOSPITAL_COMMUNITY): Payer: BLUE CROSS/BLUE SHIELD

## 2014-05-04 ENCOUNTER — Other Ambulatory Visit: Payer: Self-pay | Admitting: Family Medicine

## 2014-05-04 DIAGNOSIS — I10 Essential (primary) hypertension: Secondary | ICD-10-CM

## 2014-05-05 ENCOUNTER — Other Ambulatory Visit: Payer: Self-pay | Admitting: Family Medicine

## 2014-05-05 DIAGNOSIS — N920 Excessive and frequent menstruation with regular cycle: Secondary | ICD-10-CM

## 2014-05-06 ENCOUNTER — Ambulatory Visit (HOSPITAL_COMMUNITY)
Admission: RE | Admit: 2014-05-06 | Discharge: 2014-05-06 | Disposition: A | Discharge status: Home / Self Care | Payer: BLUE CROSS/BLUE SHIELD | Source: Ambulatory Visit | Attending: Family Medicine | Admitting: Family Medicine

## 2014-05-06 ENCOUNTER — Telehealth: Payer: Self-pay | Admitting: Family Medicine

## 2014-05-06 DIAGNOSIS — D252 Subserosal leiomyoma of uterus: Secondary | ICD-10-CM | POA: Diagnosis not present

## 2014-05-06 DIAGNOSIS — D649 Anemia, unspecified: Secondary | ICD-10-CM | POA: Diagnosis not present

## 2014-05-06 DIAGNOSIS — N949 Unspecified condition associated with female genital organs and menstrual cycle: Secondary | ICD-10-CM | POA: Insufficient documentation

## 2014-05-06 DIAGNOSIS — N920 Excessive and frequent menstruation with regular cycle: Secondary | ICD-10-CM | POA: Insufficient documentation

## 2014-05-06 NOTE — Telephone Encounter (Signed)
Pt is about to start a new job that requires a flu shot, pt would like something from her pcp excusing her from getting a flu shot, says she has never had one and doesn't want one.

## 2014-05-07 ENCOUNTER — Telehealth: Payer: Self-pay | Admitting: Family Medicine

## 2014-05-07 NOTE — Telephone Encounter (Signed)
Called to discuss [place] results (1cm fibroid) with patient and that this could potentially cause heavy bleeding during periods. After our visit at the end of January, she took the provera for 3-4 days and noticed no improvement so stopped taking it. I informed her this was likely not long enough and to follow up about it with Dr Berkley Harvey if heavy periods continue to bother her, as at that time she could take a longer course of provera (up to 10 days) and afterwards consider sonohysterogram if not improving, per rec in radiology read.  She voiced understanding.  Hilton Sinclair, MD

## 2014-05-09 NOTE — Telephone Encounter (Signed)
Will forward to Md. Jazmin Hartsell,CMA  

## 2014-05-10 NOTE — Telephone Encounter (Signed)
Shot record printed and mailed to patient.  She is aware of this. Victoria Powers,CMA

## 2014-05-10 NOTE — Telephone Encounter (Signed)
Will send message to MD again.  Unsure if this will be able to done since there is no medical reason as to why she can't have this. Jazmin Hartsell,CMA

## 2014-05-10 NOTE — Telephone Encounter (Signed)
Called and discussed the Flu shot with Victoria Powers. She doesn't have a contra-indication for the flu shot it is her personal preference not to get one. I informed her that I couldn't write a letter exempting her from the flu without medical contra-indication. She would like the records of her prior immunizations sent to her. Please call her and discuss options for her to receive her records.

## 2014-05-10 NOTE — Telephone Encounter (Signed)
Pt called back to check the status of her letter for her medical clearance that she doesn't need the flu shot. She is working at [employer]. Blima Rich

## 2014-05-26 ENCOUNTER — Other Ambulatory Visit: Payer: Self-pay | Admitting: Family Medicine

## 2014-05-26 DIAGNOSIS — I10 Essential (primary) hypertension: Secondary | ICD-10-CM

## 2014-05-30 ENCOUNTER — Other Ambulatory Visit: Payer: Self-pay | Admitting: *Deleted

## 2014-05-30 MED ORDER — VALACYCLOVIR HCL 1 G PO TABS: ORAL_TABLET | ORAL | Status: DC

## 2014-06-25 ENCOUNTER — Other Ambulatory Visit: Payer: Self-pay | Admitting: Family Medicine

## 2014-06-27 NOTE — Telephone Encounter (Signed)
Please call she needs an appointment prior to additional HTN medication refill. 1 month Rx sent in.

## 2014-06-27 NOTE — Telephone Encounter (Signed)
Letter mailed to patient. Rilley Poulter,CMA  

## 2014-07-06 ENCOUNTER — Other Ambulatory Visit (HOSPITAL_COMMUNITY)
Admission: RE | Admit: 2014-07-06 | Discharge: 2014-07-06 | Disposition: A | Discharge status: Home / Self Care | Payer: BLUE CROSS/BLUE SHIELD | Source: Ambulatory Visit | Attending: Family Medicine | Admitting: Family Medicine

## 2014-07-06 ENCOUNTER — Telehealth: Payer: Self-pay | Admitting: Family Medicine

## 2014-07-06 ENCOUNTER — Encounter: Payer: Self-pay | Admitting: Family Medicine

## 2014-07-06 ENCOUNTER — Ambulatory Visit: Payer: BLUE CROSS/BLUE SHIELD | Admitting: Family Medicine

## 2014-07-06 ENCOUNTER — Ambulatory Visit (INDEPENDENT_AMBULATORY_CARE_PROVIDER_SITE_OTHER): Payer: Self-pay | Admitting: Family Medicine

## 2014-07-06 VITALS — BP 138/81 | HR 100 | Temp 98.3°F | Ht 70.0 in | Wt 270.2 lb

## 2014-07-06 DIAGNOSIS — N898 Other specified noninflammatory disorders of vagina: Secondary | ICD-10-CM

## 2014-07-06 DIAGNOSIS — B9689 Other specified bacterial agents as the cause of diseases classified elsewhere: Secondary | ICD-10-CM

## 2014-07-06 DIAGNOSIS — Z113 Encounter for screening for infections with a predominantly sexual mode of transmission: Secondary | ICD-10-CM | POA: Insufficient documentation

## 2014-07-06 DIAGNOSIS — N760 Acute vaginitis: Secondary | ICD-10-CM

## 2014-07-06 DIAGNOSIS — A499 Bacterial infection, unspecified: Secondary | ICD-10-CM

## 2014-07-06 DIAGNOSIS — R229 Localized swelling, mass and lump, unspecified: Secondary | ICD-10-CM

## 2014-07-06 DIAGNOSIS — Z202 Contact with and (suspected) exposure to infections with a predominantly sexual mode of transmission: Secondary | ICD-10-CM

## 2014-07-06 DIAGNOSIS — R1030 Lower abdominal pain, unspecified: Secondary | ICD-10-CM

## 2014-07-06 LAB — POCT URINALYSIS DIPSTICK
BILIRUBIN UA: NEGATIVE
GLUCOSE UA: NEGATIVE
Ketones, UA: NEGATIVE
NITRITE UA: NEGATIVE
Protein, UA: NEGATIVE
Spec Grav, UA: 1.015
Urobilinogen, UA: 0.2
pH, UA: 7

## 2014-07-06 LAB — POCT URINE PREGNANCY: PREG TEST UR: NEGATIVE

## 2014-07-06 LAB — POCT WET PREP (WET MOUNT): Clue Cells Wet Prep Whiff POC: POSITIVE

## 2014-07-06 LAB — POCT UA - MICROSCOPIC ONLY: Epithelial cells, urine per micros: >20

## 2014-07-06 MED ORDER — METRONIDAZOLE 500 MG PO TABS: 500.0000 mg | ORAL_TABLET | Freq: Two times a day (BID) | ORAL | Status: DC

## 2014-07-06 MED ORDER — METRONIDAZOLE 0.75 % VA GEL: VAGINAL | Status: DC

## 2014-07-06 NOTE — Telephone Encounter (Signed)
Called to discuss results of wet prep. Will send in flagyl for treatment and metrogel for suppression. Will send urine for culture. Had many epithelial cells on micro, so doubt infection, though will evaluate this with culture. Informed patient of these things.

## 2014-07-06 NOTE — Patient Instructions (Signed)
Nice to meet you. We will call with your results and based of the results will send in medication to treat the cause. If you have worsening discomfort in you stomach, develop fever, nausea, vomiting, diarrhea seek medical attention.

## 2014-07-07 DIAGNOSIS — R229 Localized swelling, mass and lump, unspecified: Secondary | ICD-10-CM | POA: Insufficient documentation

## 2014-07-07 LAB — URINE CULTURE: Colony Count: 4000

## 2014-07-07 LAB — GC/CHLAMYDIA PROBE AMP (~~LOC~~) NOT AT ARMC
Chlamydia: NEGATIVE
NEISSERIA GONORRHEA: NEGATIVE

## 2014-07-07 NOTE — Assessment & Plan Note (Signed)
Has been off suppressive therapy and had recurrence of BV. Will treat acute issue with flagyl PO and will give Rx for metrogel for suppressive therapy. F/u GC/chlamydia. No signs of PID on exam. Benign abdominal exam. Given return precautions.

## 2014-07-07 NOTE — Progress Notes (Signed)
Patient ID: Victoria Powers, female   DOB: 10-18-1980, 34 y.o.   MRN: [id number]  Tommi Rumps, MD Phone: [phone]  [name] is a 34 y.o. female who presents today for same day appointment.   Vaginal discharge: has this chronically and has had issues with BV for a long time. Current discharge has a slight color and smell. No itching or thickness. Not sexually active in several months. Has history of STDs (gonorrhea, chlamydia, and HSV). No recent HSV outbreaks. Notes previously on metrogel and diflucan for suppressive therapy. Notes some mild lower abdominal bloating that has gotten better over the past several days. Some urgency and frequency. No hematuria. Normal BMs every day. No blood in urine. Notes has had a "hair bump" on her upper inner thigh that has grown and drained, then gotten smaller. Is mildly tender.  PMH: smoker   ROS: Per HPI   Physical Exam Filed Vitals:   07/06/14 1553  BP: 138/81  Pulse: 100  Temp: 98.3 F (36.8 C)    Gen: Well NAD Abd: soft, NT, ND GU: normal labia, there is a <1 cm nodule on upper inner right thigh in distribution of pubic hair, nontender, no fluctuance or induration, no erythema, normal vaginal mucosa, small amount of thin white d/c, normal cervical os, no cervical motion tenderness, no tenderness or masses palpated on bimanual exam   Assessment/Plan: Please see individual problem list.  Tommi Rumps, MD [place] PGY-3

## 2014-07-07 NOTE — Assessment & Plan Note (Signed)
Likely due to prior ingrown hair/furuncle, though no signs of infection at this time. Possible granulomatous/scar tissue from prior insult of ingrown hair. Advised to use warm compresses. If develops erythema, fever, enlarging lesion, or worsening pain she is to return.

## 2014-07-11 ENCOUNTER — Telehealth: Payer: Self-pay | Admitting: Family Medicine

## 2014-07-11 NOTE — Telephone Encounter (Signed)
Attempted to call patient to inform of results. Patient knows of wet prep results. Other testing done at her last visit was negative.

## 2014-07-13 ENCOUNTER — Telehealth: Payer: Self-pay | Admitting: Family Medicine

## 2014-07-13 NOTE — Telephone Encounter (Signed)
Pt called and is on the last dose of Flagyl and now needs a dose of Diflucan. jw

## 2014-07-15 ENCOUNTER — Other Ambulatory Visit: Payer: Self-pay | Admitting: *Deleted

## 2014-07-15 MED ORDER — FLUCONAZOLE 150 MG PO TABS: 150.0000 mg | ORAL_TABLET | ORAL | Status: AC | PRN

## 2014-07-15 MED ORDER — METRONIDAZOLE 0.75 % VA GEL: VAGINAL | Status: AC

## 2014-07-15 MED ORDER — METRONIDAZOLE 500 MG PO TABS: 500.0000 mg | ORAL_TABLET | Freq: Two times a day (BID) | ORAL | Status: AC

## 2014-07-15 NOTE — Telephone Encounter (Signed)
Victoria Powers called back to say she need another round of flagyl sent in to pharmacy.  Also need to have the diflucan sent in with it.  Been waiting for response since Wednesday.

## 2014-07-19 NOTE — Telephone Encounter (Signed)
Please call. I sent in refills for flagyl and diflucan on 5/13. She will need to be re-evaluated before additional refills.

## 2014-07-19 NOTE — Telephone Encounter (Signed)
Spoke with patient and she is aware of refill.  States that our office is not out of network for her and she would have to pay OOP next time she is seen, but will make an appt is needed. Jazmin Hartsell,CMA

## 2014-07-25 ENCOUNTER — Other Ambulatory Visit: Payer: Self-pay | Admitting: Family Medicine

## 2014-07-26 ENCOUNTER — Other Ambulatory Visit: Payer: Self-pay | Admitting: Family Medicine

## 2014-07-26 NOTE — Telephone Encounter (Signed)
Refill request from pharmacy. Will forward to PCP for review. Bernarr Longsworth, CMA. 

## 2014-09-29 ENCOUNTER — Other Ambulatory Visit: Payer: Self-pay | Admitting: Family Medicine

## 2014-10-20 ENCOUNTER — Other Ambulatory Visit: Payer: Self-pay | Admitting: Family Medicine

## 2014-11-22 ENCOUNTER — Other Ambulatory Visit: Payer: Self-pay | Admitting: Family Medicine

## 2014-11-22 NOTE — Telephone Encounter (Signed)
Spoke with patient and she in no longer coming to our clinic due to an insurance change.  We are not in network with the new insurance.  She had asked the pharmacy to send her request to her new [facility] but was sent here by mistake. Jazmin Hartsell,CMA

## 2016-01-22 IMAGING — US US PELVIS COMPLETE
1 series · 13 of 25 positions shown · non-contrast
Comparison: None

CLINICAL DATA: Menorrhagia, premenstrual pelvic pain, morbid
obesity, anemia.



[Series 1: us pelvis complete · 13 of 53 slices shown]
[im 1/53]
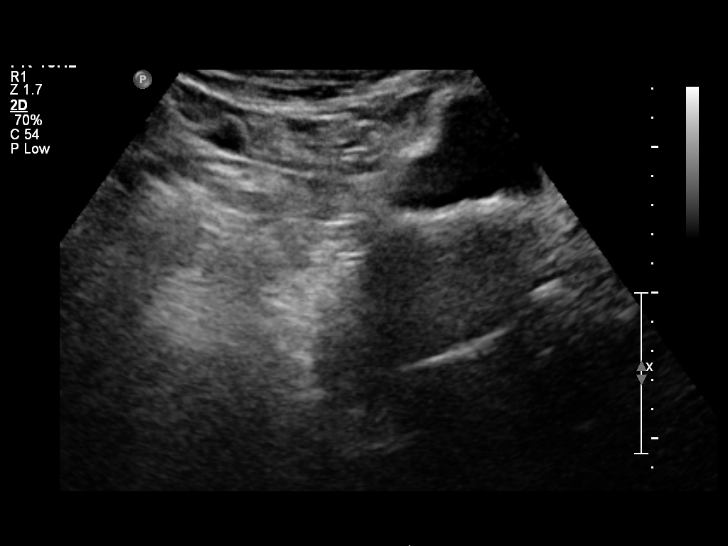
[im 5/53]
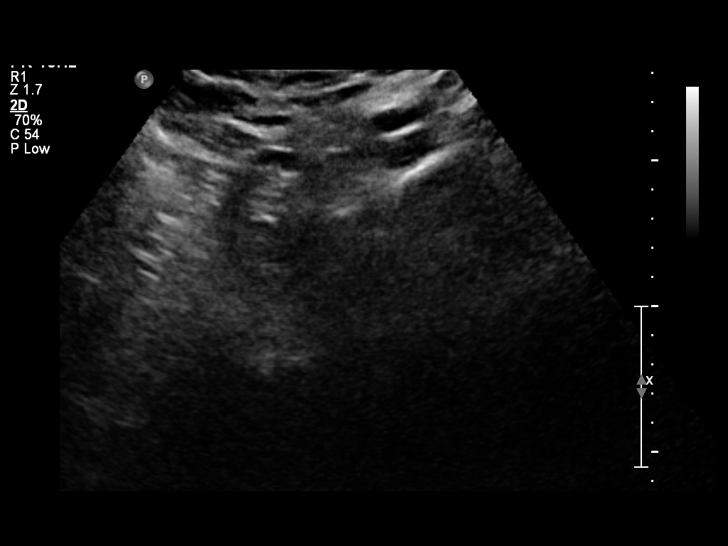
[im 9/53]
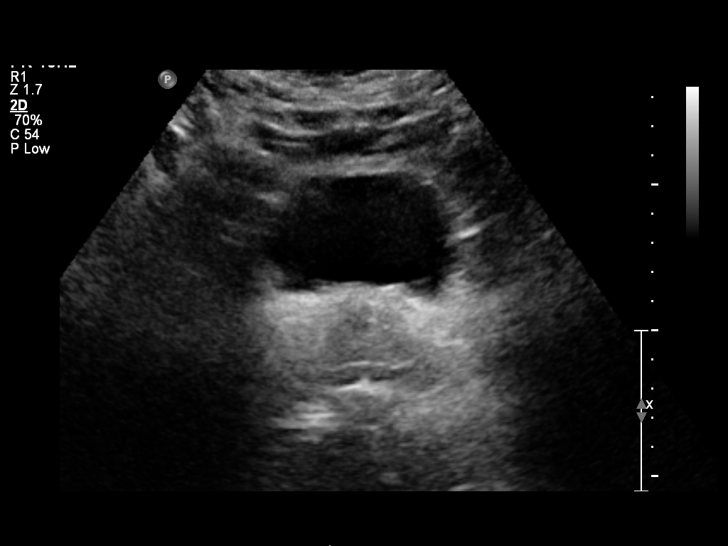
[im 14/53]
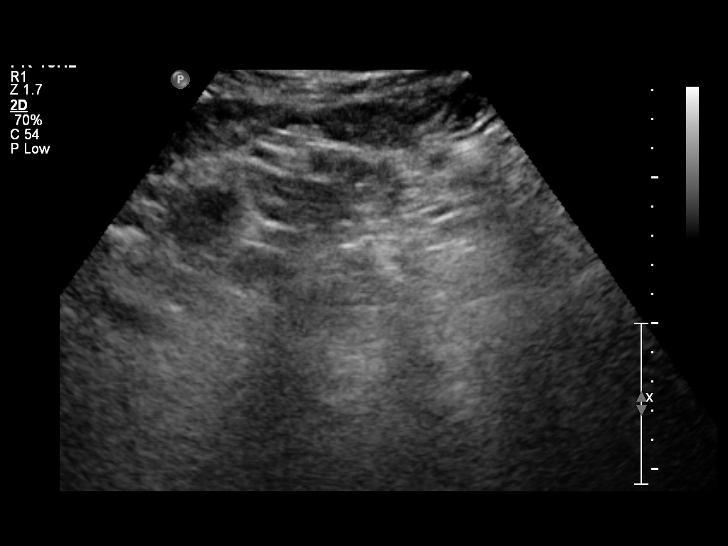
[im 18/53]
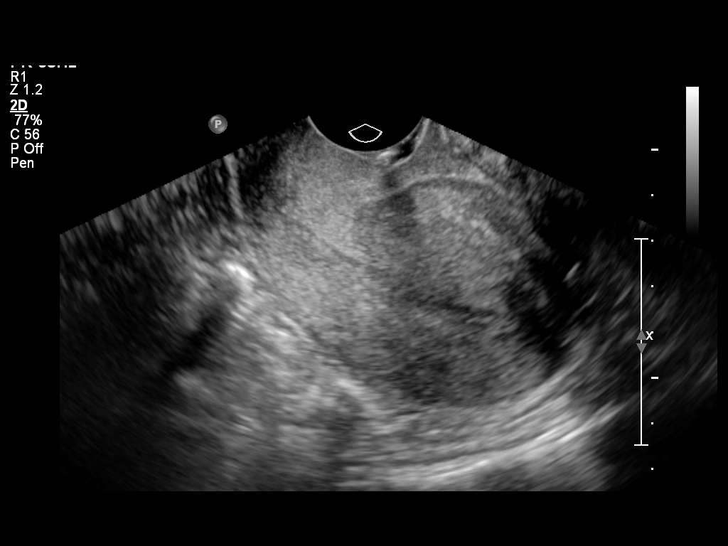
[im 22/53]
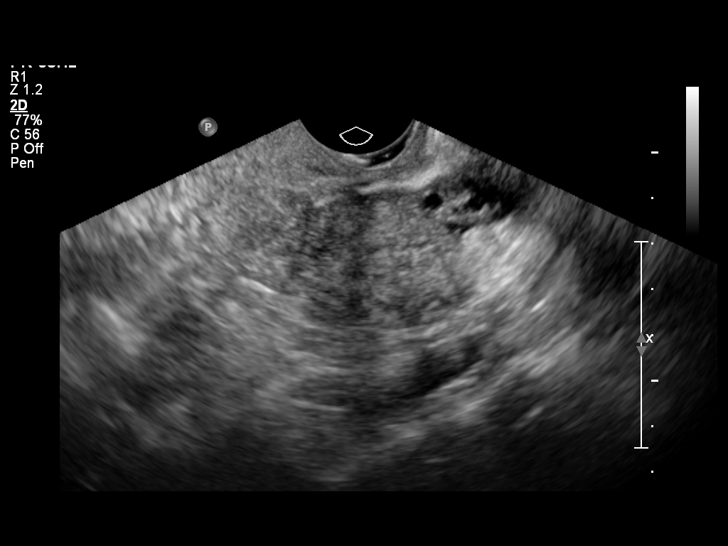
[im 27/53]
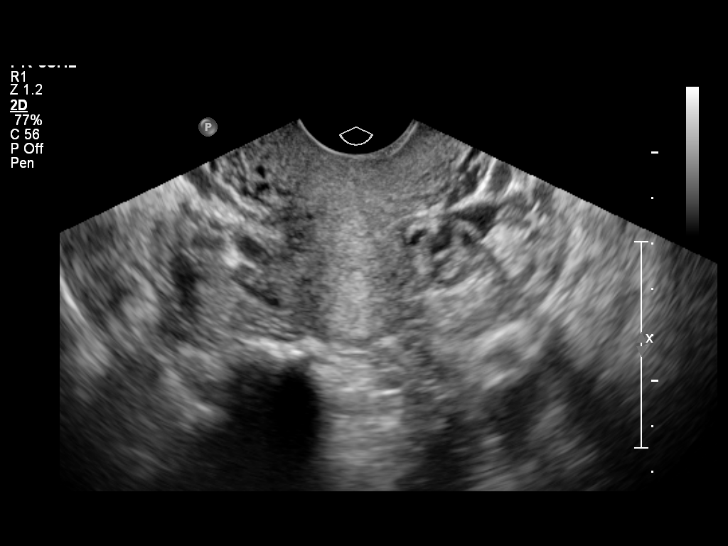
[im 31/53]
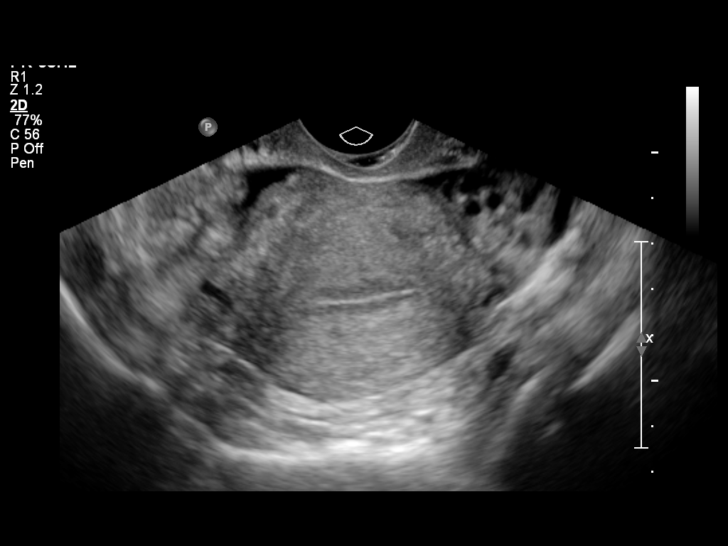
[im 35/53]
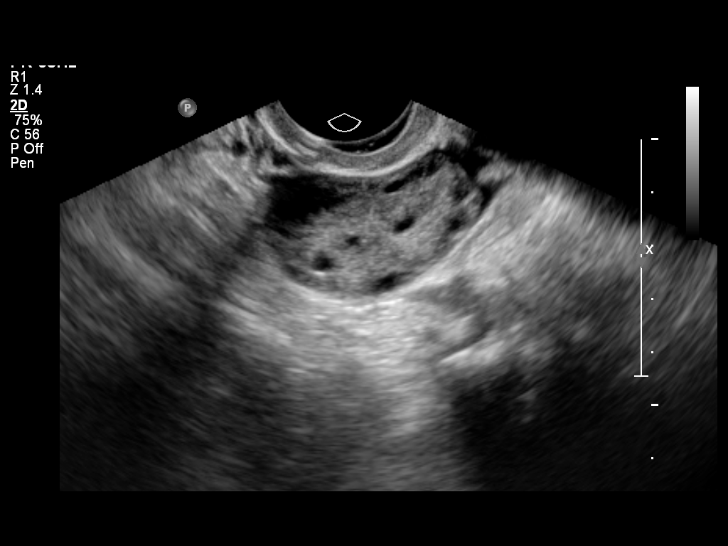
[im 40/53]
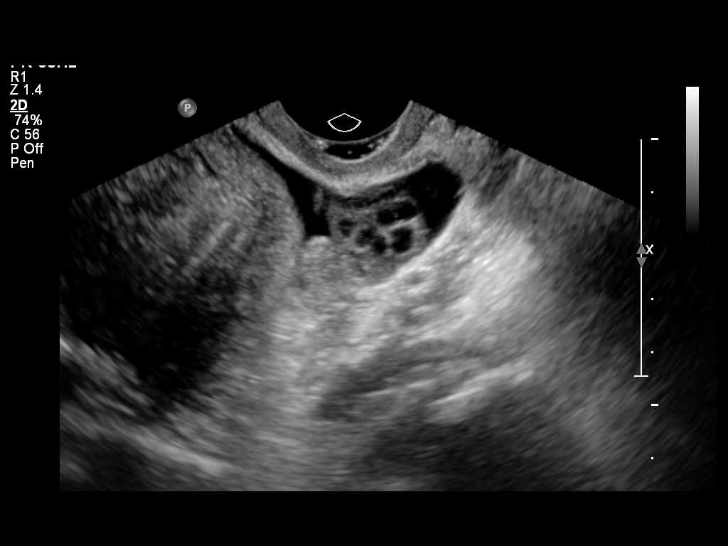
[im 44/53]
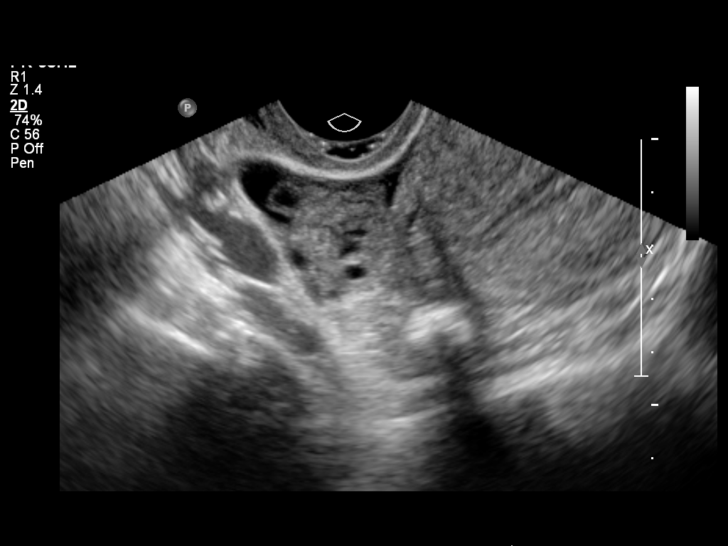
[im 48/53]
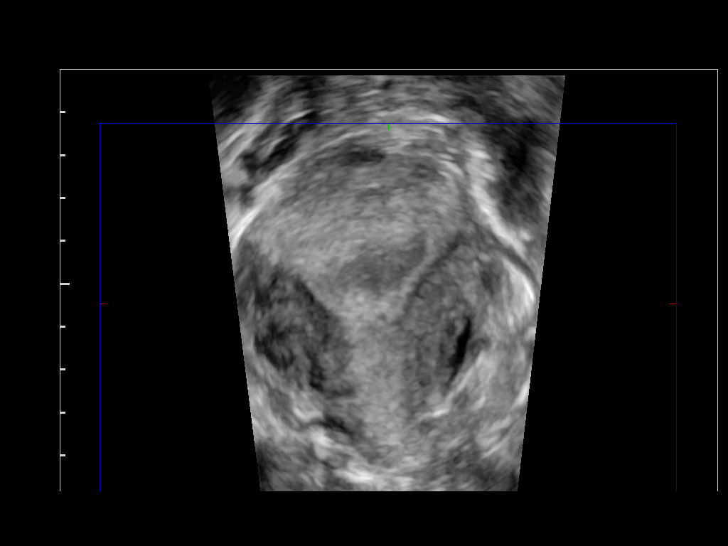
[im 53/53]
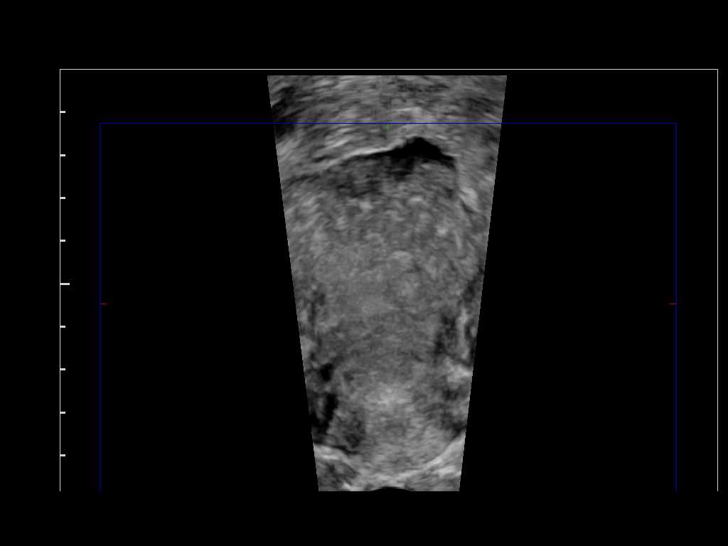

[13 of 25 positions shown; findings below may reference images not displayed]

FINDINGS: Uterus

Measurements: 8.3 x 5.1 x 6.1.. There is a tiny sub serosal fibroid
in the posterior aspect of the fundus measuring 1 cm in diameter.

Endometrium

Thickness: 9.2 mm.. A trilaminar appearance is noted consistent with
the late proliferative phase.

Right ovary

Measurements: 3.7 x 2.4 x 2.6 cm. Normal appearance/no adnexal mass.

Left ovary

Measurements: 3.9 x 2.7 x 2.3 cm. Normal appearance/no adnexal mass.

Other findings

There is a small amount of free pelvic fluid.
IMPRESSION: 1. The uterus is normal in contour. There is a 1 cm posterior fundal
fibroid. The endometrial stripe is not abnormally thickened. If
bleeding remains unresponsive to hormonal or medical therapy,
sonohysterogram should be considered for focal lesion work-up. (Ref:
Radiological Reasoning: Algorithmic Workup of Abnormal Vaginal
Bleeding with Endovaginal Sonography and Sonohysterography. AJR
3330; 191:S68-73)
2. The ovaries are normal in size and echotexture. There are no
adnexal masses. There is a small amount of free pelvic fluid.
